# Patient Record
Sex: Female | Born: 1950 | Race: Black or African American | Hispanic: No | Smoking: Never smoker
Health system: Southern US, Community
[De-identification: ages and names within clinical notes are randomized; demographics above are authoritative.]

## PROBLEM LIST (undated history)

## (undated) ENCOUNTER — Inpatient Hospital Stay: Admission: EM | Payer: Self-pay | Source: Home / Self Care

## (undated) DIAGNOSIS — K5790 Diverticulosis of intestine, part unspecified, without perforation or abscess without bleeding: Secondary | ICD-10-CM

## (undated) DIAGNOSIS — K219 Gastro-esophageal reflux disease without esophagitis: Secondary | ICD-10-CM

## (undated) DIAGNOSIS — Z8719 Personal history of other diseases of the digestive system: Secondary | ICD-10-CM

## (undated) DIAGNOSIS — R079 Chest pain, unspecified: Secondary | ICD-10-CM

## (undated) DIAGNOSIS — M797 Fibromyalgia: Secondary | ICD-10-CM

## (undated) HISTORY — DX: Chest pain, unspecified: R07.9

## (undated) HISTORY — PX: ECTOPIC PREGNANCY SURGERY: SHX613

## (undated) HISTORY — PX: EYE SURGERY: SHX253

---

## 2007-10-29 ENCOUNTER — Ambulatory Visit: Payer: Self-pay | Admitting: Nurse Practitioner

## 2007-10-29 DIAGNOSIS — D259 Leiomyoma of uterus, unspecified: Secondary | ICD-10-CM | POA: Insufficient documentation

## 2007-10-29 DIAGNOSIS — E669 Obesity, unspecified: Secondary | ICD-10-CM | POA: Insufficient documentation

## 2007-10-29 DIAGNOSIS — J45909 Unspecified asthma, uncomplicated: Secondary | ICD-10-CM | POA: Insufficient documentation

## 2007-10-29 DIAGNOSIS — I10 Essential (primary) hypertension: Secondary | ICD-10-CM | POA: Insufficient documentation

## 2007-10-29 DIAGNOSIS — K219 Gastro-esophageal reflux disease without esophagitis: Secondary | ICD-10-CM | POA: Insufficient documentation

## 2007-11-06 ENCOUNTER — Encounter (INDEPENDENT_AMBULATORY_CARE_PROVIDER_SITE_OTHER): Payer: Self-pay | Admitting: Nurse Practitioner

## 2007-11-29 ENCOUNTER — Ambulatory Visit: Payer: Self-pay | Admitting: Nurse Practitioner

## 2007-11-29 ENCOUNTER — Encounter (INDEPENDENT_AMBULATORY_CARE_PROVIDER_SITE_OTHER): Payer: Self-pay | Admitting: Nurse Practitioner

## 2007-11-29 DIAGNOSIS — M25569 Pain in unspecified knee: Secondary | ICD-10-CM | POA: Insufficient documentation

## 2007-11-29 DIAGNOSIS — Z780 Asymptomatic menopausal state: Secondary | ICD-10-CM | POA: Insufficient documentation

## 2007-11-29 DIAGNOSIS — R3129 Other microscopic hematuria: Secondary | ICD-10-CM | POA: Insufficient documentation

## 2007-11-29 LAB — CONVERTED CEMR LAB
Albumin: 4.2 g/dL (ref 3.5–5.2)
Alkaline Phosphatase: 68 units/L (ref 39–117)
BUN: 15 mg/dL (ref 6–23)
CO2: 22 meq/L (ref 19–32)
Cholesterol: 168 mg/dL (ref 0–200)
Eosinophils Absolute: 0.1 10*3/uL (ref 0.0–0.7)
Eosinophils Relative: 2 % (ref 0–5)
GC Probe Amp, Genital: NEGATIVE
Glucose, Bld: 83 mg/dL (ref 70–99)
Glucose, Urine, Semiquant: NEGATIVE
HCT: 41.1 % (ref 36.0–46.0)
HDL: 57 mg/dL (ref >39)
Helicobacter Pylori Antibody-IgG: 0.7
Hemoglobin: 13.2 g/dL (ref 12.0–15.0)
LDL Cholesterol: 95 mg/dL (ref 0–99)
Lymphocytes Relative: 45 % (ref 12–46)
Lymphs Abs: 2.5 10*3/uL (ref 0.7–4.0)
MCV: 94.5 fL (ref 78.0–100.0)
Monocytes Relative: 7 % (ref 3–12)
Nitrite: NEGATIVE
Platelets: 258 10*3/uL (ref 150–400)
RBC: 4.35 M/uL (ref 3.87–5.11)
Sodium: 144 meq/L (ref 135–145)
Total Bilirubin: 0.5 mg/dL (ref 0.3–1.2)
Total Protein: 7.1 g/dL (ref 6.0–8.3)
Triglycerides: 79 mg/dL (ref <150)
Urobilinogen, UA: 1
VLDL: 16 mg/dL (ref 0–40)
WBC: 5.6 10*3/uL (ref 4.0–10.5)
pH: 6.5

## 2007-11-30 ENCOUNTER — Encounter (INDEPENDENT_AMBULATORY_CARE_PROVIDER_SITE_OTHER): Payer: Self-pay | Admitting: Nurse Practitioner

## 2007-12-03 ENCOUNTER — Encounter (INDEPENDENT_AMBULATORY_CARE_PROVIDER_SITE_OTHER): Payer: Self-pay | Admitting: Nurse Practitioner

## 2007-12-04 ENCOUNTER — Encounter (INDEPENDENT_AMBULATORY_CARE_PROVIDER_SITE_OTHER): Payer: Self-pay | Admitting: Nurse Practitioner

## 2007-12-12 ENCOUNTER — Ambulatory Visit (HOSPITAL_COMMUNITY): Admission: RE | Admit: 2007-12-12 | Discharge: 2007-12-12 | Payer: Self-pay | Admitting: Nurse Practitioner

## 2007-12-12 ENCOUNTER — Encounter (INDEPENDENT_AMBULATORY_CARE_PROVIDER_SITE_OTHER): Payer: Self-pay | Admitting: Nurse Practitioner

## 2008-02-29 ENCOUNTER — Ambulatory Visit: Payer: Self-pay | Admitting: Nurse Practitioner

## 2008-05-14 ENCOUNTER — Telehealth (INDEPENDENT_AMBULATORY_CARE_PROVIDER_SITE_OTHER): Payer: Self-pay | Admitting: Nurse Practitioner

## 2008-07-29 ENCOUNTER — Ambulatory Visit: Payer: Self-pay | Admitting: Nurse Practitioner

## 2008-07-29 DIAGNOSIS — R209 Unspecified disturbances of skin sensation: Secondary | ICD-10-CM | POA: Insufficient documentation

## 2008-09-04 ENCOUNTER — Encounter: Payer: Self-pay | Admitting: Physician Assistant

## 2008-09-04 ENCOUNTER — Encounter (INDEPENDENT_AMBULATORY_CARE_PROVIDER_SITE_OTHER): Payer: Self-pay | Admitting: Nurse Practitioner

## 2008-10-31 ENCOUNTER — Ambulatory Visit: Payer: Self-pay | Admitting: Internal Medicine

## 2008-10-31 DIAGNOSIS — J309 Allergic rhinitis, unspecified: Secondary | ICD-10-CM | POA: Insufficient documentation

## 2008-11-28 ENCOUNTER — Encounter (INDEPENDENT_AMBULATORY_CARE_PROVIDER_SITE_OTHER): Payer: Self-pay | Admitting: Nurse Practitioner

## 2008-11-28 ENCOUNTER — Telehealth (INDEPENDENT_AMBULATORY_CARE_PROVIDER_SITE_OTHER): Payer: Self-pay | Admitting: Nurse Practitioner

## 2008-11-28 ENCOUNTER — Ambulatory Visit: Payer: Self-pay | Admitting: Nurse Practitioner

## 2008-11-28 DIAGNOSIS — K5900 Constipation, unspecified: Secondary | ICD-10-CM | POA: Insufficient documentation

## 2008-11-28 DIAGNOSIS — R5383 Other fatigue: Secondary | ICD-10-CM | POA: Insufficient documentation

## 2008-11-28 DIAGNOSIS — R5381 Other malaise: Secondary | ICD-10-CM

## 2008-11-28 LAB — CONVERTED CEMR LAB
Bilirubin Urine: NEGATIVE
Glucose, Urine, Semiquant: NEGATIVE
Ketones, urine, test strip: NEGATIVE
Specific Gravity, Urine: 1.02
pH: 6

## 2008-12-02 ENCOUNTER — Encounter (INDEPENDENT_AMBULATORY_CARE_PROVIDER_SITE_OTHER): Payer: Self-pay | Admitting: Nurse Practitioner

## 2008-12-02 DIAGNOSIS — E559 Vitamin D deficiency, unspecified: Secondary | ICD-10-CM | POA: Insufficient documentation

## 2008-12-02 LAB — CONVERTED CEMR LAB
ALT: 11 units/L (ref 0–35)
AST: 9 units/L (ref 0–37)
Albumin: 4.1 g/dL (ref 3.5–5.2)
Alkaline Phosphatase: 59 units/L (ref 39–117)
BUN: 13 mg/dL (ref 6–23)
Basophils Absolute: 0 10*3/uL (ref 0.0–0.1)
Chlamydia, DNA Probe: NEGATIVE
GC Probe Amp, Genital: NEGATIVE
HDL: 54 mg/dL (ref >39)
LDL Cholesterol: 96 mg/dL (ref 0–99)
Lymphocytes Relative: 47 % — ABNORMAL HIGH (ref 12–46)
Lymphs Abs: 2.3 10*3/uL (ref 0.7–4.0)
Microalb, Ur: 0.98 mg/dL (ref 0.00–1.89)
Neutro Abs: 2 10*3/uL (ref 1.7–7.7)
Neutrophils Relative %: 42 % — ABNORMAL LOW (ref 43–77)
Platelets: 250 10*3/uL (ref 150–400)
Potassium: 3.9 meq/L (ref 3.5–5.3)
RDW: 13.8 % (ref 11.5–15.5)
Sodium: 144 meq/L (ref 135–145)
TSH: 1.32 microintl units/mL (ref 0.350–4.500)
Total Protein: 7 g/dL (ref 6.0–8.3)
WBC: 4.8 10*3/uL (ref 4.0–10.5)

## 2008-12-16 ENCOUNTER — Ambulatory Visit (HOSPITAL_COMMUNITY): Admission: RE | Admit: 2008-12-16 | Discharge: 2008-12-16 | Payer: Self-pay | Admitting: Internal Medicine

## 2009-03-04 ENCOUNTER — Ambulatory Visit: Payer: Self-pay | Admitting: Nurse Practitioner

## 2009-03-04 LAB — CONVERTED CEMR LAB: Vit D, 25-Hydroxy: 35 ng/mL (ref 30–89)

## 2009-03-05 ENCOUNTER — Encounter (INDEPENDENT_AMBULATORY_CARE_PROVIDER_SITE_OTHER): Payer: Self-pay | Admitting: Nurse Practitioner

## 2009-03-13 ENCOUNTER — Telehealth (INDEPENDENT_AMBULATORY_CARE_PROVIDER_SITE_OTHER): Payer: Self-pay | Admitting: Nurse Practitioner

## 2009-03-29 ENCOUNTER — Encounter (INDEPENDENT_AMBULATORY_CARE_PROVIDER_SITE_OTHER): Payer: Self-pay | Admitting: Nurse Practitioner

## 2009-03-30 ENCOUNTER — Encounter (INDEPENDENT_AMBULATORY_CARE_PROVIDER_SITE_OTHER): Payer: Self-pay | Admitting: Nurse Practitioner

## 2009-04-25 ENCOUNTER — Encounter (INDEPENDENT_AMBULATORY_CARE_PROVIDER_SITE_OTHER): Payer: Self-pay | Admitting: Nurse Practitioner

## 2009-05-06 ENCOUNTER — Telehealth (INDEPENDENT_AMBULATORY_CARE_PROVIDER_SITE_OTHER): Payer: Self-pay | Admitting: Nurse Practitioner

## 2009-11-16 ENCOUNTER — Telehealth (INDEPENDENT_AMBULATORY_CARE_PROVIDER_SITE_OTHER): Payer: Self-pay | Admitting: Nurse Practitioner

## 2009-11-25 ENCOUNTER — Ambulatory Visit: Payer: Self-pay | Admitting: Nurse Practitioner

## 2009-11-26 ENCOUNTER — Encounter (INDEPENDENT_AMBULATORY_CARE_PROVIDER_SITE_OTHER): Payer: Self-pay | Admitting: Nurse Practitioner

## 2009-11-26 DIAGNOSIS — R809 Proteinuria, unspecified: Secondary | ICD-10-CM | POA: Insufficient documentation

## 2009-11-26 LAB — CONVERTED CEMR LAB
ALT: 9 units/L (ref 0–35)
Alkaline Phosphatase: 61 units/L (ref 39–117)
Basophils Absolute: 0 10*3/uL (ref 0.0–0.1)
Eosinophils Absolute: 0.2 10*3/uL (ref 0.0–0.7)
Eosinophils Relative: 3 % (ref 0–5)
HCT: 40.4 % (ref 36.0–46.0)
Lymphocytes Relative: 42 % (ref 12–46)
MCV: 92 fL (ref 78.0–100.0)
Platelets: 259 10*3/uL (ref 150–400)
RDW: 13.7 % (ref 11.5–15.5)
Sodium: 142 meq/L (ref 135–145)
Total Bilirubin: 0.3 mg/dL (ref 0.3–1.2)
Total Protein: 6.7 g/dL (ref 6.0–8.3)

## 2009-11-27 ENCOUNTER — Telehealth (INDEPENDENT_AMBULATORY_CARE_PROVIDER_SITE_OTHER): Payer: Self-pay | Admitting: Nurse Practitioner

## 2009-11-27 ENCOUNTER — Ambulatory Visit: Payer: Self-pay | Admitting: Nurse Practitioner

## 2009-11-27 LAB — CONVERTED CEMR LAB
HDL: 58 mg/dL (ref >39)
LDL Cholesterol: 109 mg/dL — ABNORMAL HIGH (ref 0–99)
Triglycerides: 83 mg/dL (ref <150)
VLDL: 17 mg/dL (ref 0–40)

## 2009-12-01 ENCOUNTER — Encounter (INDEPENDENT_AMBULATORY_CARE_PROVIDER_SITE_OTHER): Payer: Self-pay | Admitting: Nurse Practitioner

## 2009-12-17 ENCOUNTER — Ambulatory Visit (HOSPITAL_COMMUNITY)
Admission: RE | Admit: 2009-12-17 | Discharge: 2009-12-17 | Payer: Self-pay | Source: Home / Self Care | Admitting: Internal Medicine

## 2010-03-07 ENCOUNTER — Encounter: Payer: Self-pay | Admitting: Internal Medicine

## 2010-03-16 NOTE — Letter (Signed)
Summary: TRIAGE CALL  TRIAGE CALL   Imported By: Arta Bruce 07/16/2009 14:22:16  _____________________________________________________________________  External Attachment:    Type:   Image     Comment:   External Document

## 2010-03-16 NOTE — Letter (Signed)
Summary: Lipid Letter  [facility]  [address] [place], [place] [address]   Phone: [phone]  Fax: [phone]    12/01/2009  Jyoti Harju [address] [address] [place], [place]  [address]  Dear Ms. Doepke:  We have carefully reviewed your last lipid profile from 11/27/2009 and the results are noted below with a summary of recommendations for lipid management.    Cholesterol:       184     Goal: less than 200   HDL "good" Cholesterol:   58     Goal: greater than 40   LDL "bad" Cholesterol:   109     Goal: less than 130   Triglycerides:       83     Goal: less than 150    Cholesterol labs are normal.     Current Medications: 1)    Hydrochlorothiazide 12.5 Mg Tabs (Hydrochlorothiazide) .Marland Kitchen.. 1 tablet by mouth daily for blood pressure 2)    Nexium 40 Mg Cpdr (Esomeprazole magnesium) .... One tablet by mouth 30 minutes before breakfast daily 3)    Fluticasone Propionate 50 Mcg/act Susp (Fluticasone propionate) .... 2 sprays each nostril daily 4)    Cetirizine Hcl 10 Mg Tabs (Cetirizine hcl) .... One tablet by mouth daily for allergies 5)    Patanol 0.1 % Soln (Olopatadine hcl) .Marland Kitchen.. 1-2 drops both eyes two times a day as needed allergies 6)    Miralax  Powd (Polyethylene glycol 3350) .Marland Kitchen.. 1 capful of powder in 8oz of water or juice daily.  If you have any questions, please call. We appreciate being able to work with you.   Sincerely,    [facility] Lehman Prom FNP

## 2010-03-16 NOTE — Medication Information (Signed)
Summary: RX Folder//MAP   RX Folder//MAP   Imported By: Arta Bruce 03/30/2009 10:52:05  _____________________________________________________________________  External Attachment:    Type:   Image     Comment:   External Document

## 2010-03-16 NOTE — Progress Notes (Signed)
Summary: HCTZ refill  Phone Note Outgoing Call   Summary of Call: I see pt called - CALL A NURSE for HCTZ refill. she was instructed to call this office the following monday for refills and i don't see that she did call pt - let her know Rx is ready -  fax to [facility] (in basket) Initial call taken by: Lehman Prom FNP,  May 06, 2009 8:31 AM  Follow-up for Phone Call        Brooke Marshall  May 06, 2009 8:57 AM Left message on machine for pt to return call to the office.  pt informed. Follow-up by: Brooke Marshall,  May 06, 2009 3:08 PM    Prescriptions: HYDROCHLOROTHIAZIDE 12.5 MG TABS (HYDROCHLOROTHIAZIDE) 1 tablet by mouth daily for blood pressure  #30 x 6   Entered and Authorized by:   Lehman Prom FNP   Signed by:   Lehman Prom FNP on 05/06/2009   Method used:   Print then Give to Patient   RxID:   [id number]   Appended Document: HCTZ refill Rx faxed to [facility] pharmacy

## 2010-03-16 NOTE — Assessment & Plan Note (Signed)
Summary: HTN   Vital Signs:  Patient profile:   60 year old female Menstrual status:  postmenopausal Height:      66 inches Weight:      274.4 pounds BMI:     44.45 Temp:     97.6 degrees F oral Pulse rate:   64 / minute Pulse rhythm:   regular Resp:     16 per minute BP sitting:   122 / 80  (left arm) Cuff size:   large  Vitals Entered By: Levon Hedger (November 25, 2009 3:15 PM)  Nutrition Counseling: Patient's BMI is greater than 25 and therefore counseled on weight management options. CC: needs check up has not been seen in office in 1 year, Hypertension Management, Abdominal Pain Is Patient Diabetic? No Pain Assessment Patient in pain? no       Does patient need assistance? Functional Status Self care Ambulation Normal   CC:  needs check up has not been seen in office in 1 year, Hypertension Management, and Abdominal Pain.  History of Present Illness:  P tinto the office for f/u - HTN last visit 1 year ago  Dyspepsia History:      She has no alarm features of dyspepsia including no history of melena, hematochezia, dysphagia, persistent vomiting, or involuntary weight loss > 5%.  There is a prior history of GERD.  The patient does not have a prior history of documented ulcer disease.  The dominant symptom is heartburn or acid reflux.  An H-2 blocker medication is not currently being taken.  A prior EGD has been done which showed no evidence for moderate or severe esophagitis or Barrett's esophagus.    Hypertension History:      She denies headache, chest pain, and palpitations.  She notes no problems with any antihypertensive medication side effects.        Positive major cardiovascular risk factors include female age 40 years old or older and hypertension.  Negative major cardiovascular risk factors include negative family history for ischemic heart disease and non-tobacco-user status.        Further assessment for target organ damage reveals no history of ASHD,  cardiac end-organ damage (CHF/LVH), stroke/TIA, peripheral vascular disease, renal insufficiency, or hypertensive retinopathy.      Medications Prior to Update: 1)  Hydrochlorothiazide 12.5 Mg Tabs (Hydrochlorothiazide) .Marland Kitchen.. 1 Tablet By Mouth Daily For Blood Pressure 2)  Nexium 40 Mg Cpdr (Esomeprazole Magnesium) .... One Tablet By Mouth 30 Minutes Before Breakfast Daily 3)  Fluticasone Propionate 50 Mcg/act Susp (Fluticasone Propionate) .... 2 Sprays Each Nostril Daily 4)  Fexofenadine Hcl 180 Mg Tabs (Fexofenadine Hcl) .Marland Kitchen.. 1 Tab By Mouth Daily As Needed For Allergies 5)  Patanol 0.1 % Soln (Olopatadine Hcl) .Marland Kitchen.. 1-2 Drops Both Eyes Two Times A Day As Needed Allergies 6)  Tramadol Hcl 50 Mg Tabs (Tramadol Hcl) .... Take One Tablet By Mouth 4 Times Daily As Needed *stephen Miller,md 7)  Vitamin D (Ergocalciferol) 50000 Unit Caps (Ergocalciferol) .... One Tablet By Mouth Weekly 8)  Miralax  Powd (Polyethylene Glycol 3350) .Marland Kitchen.. 1 Capful of Powder in 8oz of Water or Juice Daily.  Allergies (verified): No Known Drug Allergies  Review of Systems General:  Denies fatigue. CV:  Denies chest pain or discomfort. Resp:  Denies cough. GI:  Denies abdominal pain, nausea, and vomiting. MS:  Complains of joint pain and stiffness; right knee stiffness at times; mostly when she goes from sitting to standing position. Allergy:  Denies itching eyes and sneezing;  symptoms are controlled now with patanol and cetirizine.  Physical Exam  General:  alert.   Head:  normocephalic.   Eyes:  pupils reactive to light.   Ears:  ear piercing(s) noted.   Lungs:  normal breath sounds.   Heart:  normal rate and regular rhythm.   Abdomen:  normal bowel sounds.   Msk:  normal ROM.   Neurologic:  alert & oriented X3.   Skin:  color normal.   Psych:  Oriented X3.     Impression & Recommendations:  Problem # 1:  HYPERTENSION, BENIGN ESSENTIAL (ICD-401.1) BP is stable DASH diet Her updated medication list for  this problem includes:    Hydrochlorothiazide 12.5 Mg Tabs (Hydrochlorothiazide) .Marland Kitchen... 1 tablet by mouth daily for blood pressure  Orders: T-Comprehensive Metabolic Panel (45409-81191) T-CBC w/Diff (47829-56213) T-Syphilis Test (RPR) (08657-84696) T-TSH (29528-41324) Rapid HIV  (40102) T-Urine Microalbumin w/creat. ratio [id number]) UA Dipstick w/o Micro (manual) (59563)  Problem # 2:  OBESITY (ICD-278.00) weight gain since last visit  advised pt to increase her exercise  Problem # 3:  VITAMIN D DEFICIENCY (ICD-268.9) pt is taking vitamin supplement daily  Problem # 4:  REACTIVE AIRWAY DISEASE (ICD-493.90) stable  Complete Medication List: 1)  Hydrochlorothiazide 12.5 Mg Tabs (Hydrochlorothiazide) .Marland Kitchen.. 1 tablet by mouth daily for blood pressure 2)  Nexium 40 Mg Cpdr (Esomeprazole magnesium) .... One tablet by mouth 30 minutes before breakfast daily 3)  Fluticasone Propionate 50 Mcg/act Susp (Fluticasone propionate) .... 2 sprays each nostril daily 4)  Cetirizine Hcl 10 Mg Tabs (Cetirizine hcl) .... One tablet by mouth daily for allergies 5)  Patanol 0.1 % Soln (Olopatadine hcl) .Marland Kitchen.. 1-2 drops both eyes two times a day as needed allergies 6)  Miralax Powd (Polyethylene glycol 3350) .Marland Kitchen.. 1 capful of powder in 8oz of water or juice daily.  Other Orders: Mammogram (Screening) (Mammo)  Hypertension Assessment/Plan:      The patient's hypertensive risk group is category B: At least one risk factor (excluding diabetes) with no target organ damage.  Her calculated 10 year risk of coronary heart disease is 7 %.  Today's blood pressure is 122/80.  Her blood pressure goal is < 140/90.  Patient Instructions: 1)  You have declined the flu vaccine today.  If you reconsider then inform this office and you can get a nurse visit. Remember the flu vaccine is not a live virus so it will not make you sick. 2)  Keep your appointment for mammogram 3)  Schedule an appointment for a lab visit -  lipids 4)  no food after midnight before this visit. 5)  Continue current medications.   6)  Follow up at least every 6 months for high blood pressure. Prescriptions: HYDROCHLOROTHIAZIDE 12.5 MG TABS (HYDROCHLOROTHIAZIDE) 1 tablet by mouth daily for blood pressure  #30 x 6   Entered and Authorized by:   Lehman Prom FNP   Signed by:   Lehman Prom FNP on 11/25/2009   Method used:   Print then Give to Patient   RxID:   [id number] NEXIUM 40 MG CPDR (ESOMEPRAZOLE MAGNESIUM) One tablet by mouth 30 minutes before breakfast daily  #30 x 5   Entered and Authorized by:   Lehman Prom FNP   Signed by:   Lehman Prom FNP on 11/25/2009   Method used:   Print then Give to Patient   RxID:   [id number]   Prevention & Chronic Care Immunizations   Influenza vaccine: refused  (11/25/2009)   Influenza vaccine deferral:  Refused  (11/28/2008)    Tetanus booster: 11/29/2007: Tdap    Pneumococcal vaccine: Not documented    H. zoster vaccine: Not documented  Colorectal Screening   Hemoccult: Not documented   Hemoccult action/deferral: Given x 3  (11/29/2007)    Colonoscopy: Results: Diverticulosis.         (02/14/2002)   Colonoscopy action/deferral: Repeat colonoscopy in 5 years.   (02/14/2002)  Other Screening   Pap smear:  Specimen Adequacy: Satisfactory for evaluation.   Interpretation/Result:Negative for intraepithelial Lesion or Malignancy.     (11/28/2008)   Pap smear action/deferral: PAP smear done  (11/29/2007)    Mammogram: ASSESSMENT: Negative - BI-RADS 1^MM DIGITAL SCREENING  (12/16/2008)   Mammogram action/deferral: mammogram ordered  (11/29/2007)    DXA bone density scan: normal  (12/12/2007)   Smoking status: never  (11/28/2008)  Lipids   Total Cholesterol: 168  (11/28/2008)   LDL: 96  (11/28/2008)   LDL Direct: Not documented   HDL: 54  (11/28/2008)   Triglycerides: 92  (11/28/2008)  Hypertension   Last Blood Pressure: 122 / 80   (11/25/2009)   Serum creatinine: 0.89  (11/28/2008)   Serum potassium 3.9  (11/28/2008) CMP ordered   Self-Management Support :    Hypertension self-management support: Not documented  Appended Document: HTN    Clinical Lists Changes  Observations: Added new observation of SPEC GR URIN: >=1.030 (11/25/2009 17:10) Added new observation of PH URINE: 5.5  (11/25/2009 17:10) Added new observation of UA COLOR: orange  (11/25/2009 17:10) Added new observation of WBC DIPSTK U: negative  (11/25/2009 17:10) Added new observation of NITRITE URN: negative  (11/25/2009 17:10) Added new observation of UROBILINOGEN: 0.2  (11/25/2009 17:10) Added new observation of PROTEIN, URN: negative  (11/25/2009 17:10) Added new observation of BLOOD UR DIP: small  (11/25/2009 17:10) Added new observation of KETONES URN: small (15)  (11/25/2009 17:10) Added new observation of BILIRUBIN UR: small  (11/25/2009 17:10) Added new observation of GLUCOSE, URN: negative  (11/25/2009 17:10)      Laboratory Results   Urine Tests  Date/Time Received: November 25, 2009 5:10 PM   Routine Urinalysis   Color: orange Glucose: negative   (Normal Range: Negative) Bilirubin: small   (Normal Range: Negative) Ketone: small (15)   (Normal Range: Negative) Spec. Gravity: >=1.030   (Normal Range: 1.003-1.035) Blood: small   (Normal Range: Negative) pH: 5.5   (Normal Range: 5.0-8.0) Protein: negative   (Normal Range: Negative) Urobilinogen: 0.2   (Normal Range: 0-1) Nitrite: negative   (Normal Range: Negative) Leukocyte Esterace: negative   (Normal Range: Negative)

## 2010-03-16 NOTE — Miscellaneous (Signed)
Summary: Med change - Nexium  Clinical Lists Changes  Medications: Changed medication from OMEPRAZOLE 20 MG TBEC (OMEPRAZOLE) 1 tablet by mouth two times a day for stomach to NEXIUM 40 MG CPDR (ESOMEPRAZOLE MAGNESIUM) One tablet by mouth 30 minutes before breakfast daily - Signed Rx of NEXIUM 40 MG CPDR (ESOMEPRAZOLE MAGNESIUM) One tablet by mouth 30 minutes before breakfast daily;  #30 x 5;  Signed;  Entered by: Lehman Prom FNP;  Authorized by: Lehman Prom FNP;  Method used: Printed then faxed to [facility], [address] [place], [place], [place]  [address], Ph: [phone], Fax: [phone]    Prescriptions: NEXIUM 40 MG CPDR (ESOMEPRAZOLE MAGNESIUM) One tablet by mouth 30 minutes before breakfast daily  #30 x 5   Entered and Authorized by:   Lehman Prom FNP   Signed by:   Lehman Prom FNP on 03/29/2009   Method used:   Printed then faxed to ...       [facility] Department (retail)       [address] [place], [place]  [address]       Ph: [phone]       Fax: [phone]   RxID:   [id number]

## 2010-03-16 NOTE — Letter (Signed)
Summary: *HSN Results Follow up  [facility]  [address] [place], [place] [address]   Phone: [phone]  Fax: [phone]      11/26/2009   [facility] [place] [address] [place], [place]  [address]   Dear  Ms. Jailyne Kelty,                            ____S.Drinkard,FNP   ____D. Gore,FNP       ____B. McPherson,MD   ____V. Rankins,MD    ____E. Mulberry,MD    __X__N. Daphine Deutscher, FNP  ____D. Reche Dixon, MD    ____K. Philipp Deputy, MD    ____Other     This letter is to inform you that your recent test(s):  _______Pap Smear    ___X____Lab Test     _______X-ray    ___X____ is within acceptable limits  _______ requires a medication change  _______ requires a follow-up lab visit  _______ requires a follow-up visit with your provider   Comments: Labs done during recent office visit are normal.       _________________________________________________________ If you have any questions, please contact our office [phone].                    Sincerely,    Lehman Prom FNP [facility]

## 2010-03-16 NOTE — Progress Notes (Signed)
Summary: Stomach problems  Phone Note Call from Patient Call back at [facility] Phone [phone] Call back at [phone]   Summary of Call: The pt is aware that her lab results was mailed, but she still has some concern in reference to her stomach perhaps  because she was taking fish vitamin but it seems that she has a lot of flatulence and  her stomach has a strange sound and also pain.  The pt had been taking yogurt, grain cereal but seem not helping her.  Please call her back as soon possible. [facility] FNP Initial call taken by: Manon Hilding,  March 13, 2009 10:29 AM  Follow-up for Phone Call        Levon Hedger  March 16, 2009 9:31 AM pt states she was having cutting gas pains and wasnt feeling good,  not going to the bathroom for a bowel movement ,alot of  gas x 2 weeks and finallly had a bowel movement on Saturday but was not that large she has no appetite full and bloated feeling nervous and gittery.  Pt states her mother had bowel problem and this has got her very nervous  Levon Hedger  March 16, 2009 9:39 AM   Additional Follow-up for Phone Call Additional follow up Details #1::        It appears that pt does have a history of constipation for which she should be eating lots of raisins, apples with peeling, beans, grean veges, oatmeal to keep stools soft.  She should also drink plenty of water and walking helps.  Eating lots of cheese constipations pt so she should avoid. I would advise she also start a fiber supplement - miralax. Mix 1 capful with 8oz of water or juice daily.  This is available over the counter but would be less expensive if she purchased from [facility]. Rx faxed there.  Additional Follow-up by: Lehman Prom FNP,  March 16, 2009 10:03 AM    Additional Follow-up for Phone Call Additional follow up Details #2::    spoke with pt she is aware of above information. Follow-up by: Levon Hedger,  March 16, 2009 10:17 AM  New/Updated  Medications: MIRALAX  POWD (POLYETHYLENE GLYCOL 3350) 1 capful of powder in 8oz of water or juice daily. Prescriptions: MIRALAX  POWD (POLYETHYLENE GLYCOL 3350) 1 capful of powder in 8oz of water or juice daily.  #1 month qs x 5   Entered and Authorized by:   Lehman Prom FNP   Signed by:   Lehman Prom FNP on 03/16/2009   Method used:   Faxed to ...       [facility] (retail)       [address] [place], [place]  [address]       Ph: [phone]       Fax: [phone]   RxID:   [id number]

## 2010-03-16 NOTE — Letter (Signed)
Summary: *HSN Results Follow up  [facility]  [address] [place], [place] [address]   Phone: [phone]  Fax: [phone]      03/05/2009   [facility] [place] [address] [place], [place]  [address]   Dear  Ms. Brooke Marshall,                            ____S.Drinkard,FNP   ____D. Gore,FNP       ____B. McPherson,MD   ____V. Rankins,MD    ____E. Mulberry,MD    __X__N. Daphine Deutscher, FNP  ____D. Reche Dixon, MD    ____K. Philipp Deputy, MD    ____Other     This letter is to inform you that your recent test(s):  _______Pap Smear    ___X____Lab Test     _______X-ray    ___X____ is within acceptable limits  _______ requires a medication change  _______ requires a follow-up lab visit  _______ requires a follow-up visit with your provider   Comments:  Your Vitamin D level is now within normal limits.  I would suggest you take a multivitamin daily (this should contain a daily amount of vitamin D) to be sure your level stays up.  _________________________________________________________ If you have any questions, please contact our office [phone].                    Sincerely,    Lehman Prom FNP [facility]

## 2010-03-16 NOTE — Progress Notes (Signed)
Summary: requesting addidtional test  Phone Note Call from Patient Call back at Home Phone [phone]   Action Taken: Phone Call Completed Summary of Call: pt states she wanted to see how long before she is due for another colonscopy and endoscopy. last was in 2004. she thought we recieved results from records from [place]. she was diagnosised with diverticulitis and hiatial hernia. Initial call taken by: Hassell Halim CMA,  November 27, 2009 8:51 AM  Follow-up for Phone Call        looked back - yes, pt had test in 2004 and was dx with diverticulosis. next colonscopy recommended in 2014. If pt is continues to have a problem she may consider having a barium enema which we can schedule at [facility] but she will need to do the exercises to strengthen up her spincter tone as discussed during recent visit. Follow-up by: Lehman Prom FNP,  November 27, 2009 9:14 AM  Additional Follow-up for Phone Call Additional follow up Details #1::        Pt. notified of above and encouraged to do exercises as instructed. Additional Follow-up by: Gaylyn Cheers RN,  December 02, 2009 11:39 AM

## 2010-03-16 NOTE — Progress Notes (Signed)
Summary: Query:  Refill Flonase?  Phone Note Outgoing Call   Summary of Call: Do you want to refill her flonase?  Last seen 11/14/08. Initial call taken by: Dutch Quint RN,  November 16, 2009 12:30 PM  Follow-up for Phone Call        ? if she is still a pt here - thought she transfered to another office contact pt and ask her. if she is still a HS pt advised that she needs an appt. if her orange card is not current then get her an eligibilty appt.   May refill x 30 days if she is still a pt Follow-up by: Lehman Prom FNP,  November 17, 2009 8:42 AM  Additional Follow-up for Phone Call Additional follow up Details #1::        Left message on voicemail for pt. to return call.  Dutch Quint RN  November 17, 2009 2:30 PM     Additional Follow-up for Phone Call Additional follow up Details #2::    PATIENT CALLED TO SEE ABOUT NEXIUM, SHE SAYS SHE DIDN'T ASK ABOUT ABOUT ANY NASAL SPRAY, and she is still a patient here.Cala Bradford Tinnin  November 18, 2009 10:22 AM  States she only needs Nexium refilled -- not the Flonase.  Appt. made for 11/25/09.  Refill Nexium for 30 days?  Dutch Quint RN  November 18, 2009 11:31 AM  yes, ok to  refill her nexium now for 30 days. n.martin,fnp November 18, 2009  12:58 PM  Refills completed.  Dutch Quint RN  November 19, 2009 5:01 PM   Prescriptions: FLUTICASONE PROPIONATE 50 MCG/ACT SUSP (FLUTICASONE PROPIONATE) 2 sprays each nostril daily  #1 x 0   Entered by:   Dutch Quint RN   Authorized by:   Lehman Prom FNP   Signed by:   Dutch Quint RN on 11/19/2009   Method used:   Printed then faxed to ...       [facility] Department (retail)       [address] [place], [place]  [address]       Ph: [phone]       Fax: [phone]   RxID:   [id number] NEXIUM 40 MG CPDR (ESOMEPRAZOLE MAGNESIUM) One tablet by mouth 30 minutes before breakfast daily  #30 x 0   Entered by:   Dutch Quint RN   Authorized by:   Lehman Prom  FNP   Signed by:   Dutch Quint RN on 11/19/2009   Method used:   Printed then faxed to ...       [facility] Department (retail)       [address] [place], [place]  [address]       Ph: [phone]       Fax: [phone]   RxID:   [id number]

## 2010-04-09 ENCOUNTER — Telehealth (INDEPENDENT_AMBULATORY_CARE_PROVIDER_SITE_OTHER): Payer: Self-pay | Admitting: Nurse Practitioner

## 2010-04-22 NOTE — Progress Notes (Signed)
Summary: Query:  Refill flonase?  Phone Note Outgoing Call   Summary of Call: Last seen early October 2011.  Refill flonase and HCTZ per protocol? Initial call taken by: Dutch Quint RN,  April 09, 2010 6:30 PM  Follow-up for Phone Call        yes, ok to refill Follow-up by: Lehman Prom FNP,  April 13, 2010 1:29 PM  Additional Follow-up for Phone Call Additional follow up Details #1::        Noted.  Refill completed.  Dutch Quint RN  April 13, 2010 4:20 PM     Prescriptions: FLUTICASONE PROPIONATE 50 MCG/ACT SUSP (FLUTICASONE PROPIONATE) 2 sprays each nostril daily  #1 x 0   Entered by:   Dutch Quint RN   Authorized by:   Lehman Prom FNP   Signed by:   Dutch Quint RN on 04/13/2010   Method used:   Historical   RxID:   [id number]

## 2010-12-29 ENCOUNTER — Other Ambulatory Visit: Payer: Self-pay | Admitting: Internal Medicine

## 2010-12-29 DIAGNOSIS — Z1231 Encounter for screening mammogram for malignant neoplasm of breast: Secondary | ICD-10-CM

## 2011-01-27 ENCOUNTER — Ambulatory Visit (HOSPITAL_COMMUNITY)
Admission: RE | Admit: 2011-01-27 | Discharge: 2011-01-27 | Disposition: A | Discharge status: Home / Self Care | Payer: Self-pay | Source: Ambulatory Visit | Attending: Internal Medicine | Admitting: Internal Medicine

## 2011-01-27 DIAGNOSIS — Z1231 Encounter for screening mammogram for malignant neoplasm of breast: Secondary | ICD-10-CM | POA: Insufficient documentation

## 2011-03-08 ENCOUNTER — Other Ambulatory Visit: Payer: Self-pay | Admitting: Internal Medicine

## 2014-01-23 ENCOUNTER — Other Ambulatory Visit (HOSPITAL_COMMUNITY): Payer: Self-pay | Admitting: Nurse Practitioner

## 2014-01-23 DIAGNOSIS — Z1231 Encounter for screening mammogram for malignant neoplasm of breast: Secondary | ICD-10-CM

## 2014-01-31 ENCOUNTER — Ambulatory Visit (HOSPITAL_COMMUNITY)
Admission: RE | Admit: 2014-01-31 | Discharge: 2014-01-31 | Disposition: A | Discharge status: Home / Self Care | Payer: 59 | Source: Ambulatory Visit | Attending: Nurse Practitioner | Admitting: Nurse Practitioner

## 2014-01-31 DIAGNOSIS — Z1231 Encounter for screening mammogram for malignant neoplasm of breast: Secondary | ICD-10-CM | POA: Insufficient documentation

## 2014-07-30 ENCOUNTER — Other Ambulatory Visit: Payer: Self-pay | Admitting: Gastroenterology

## 2014-10-28 ENCOUNTER — Encounter (HOSPITAL_COMMUNITY): Payer: Self-pay | Admitting: *Deleted

## 2014-11-04 ENCOUNTER — Encounter (HOSPITAL_COMMUNITY): Payer: Self-pay | Admitting: *Deleted

## 2014-11-04 ENCOUNTER — Encounter (HOSPITAL_COMMUNITY)
Admission: RE | Disposition: A | Discharge status: Home / Self Care | Payer: Self-pay | Source: Ambulatory Visit | Attending: Gastroenterology

## 2014-11-04 ENCOUNTER — Ambulatory Visit (HOSPITAL_COMMUNITY): Payer: PPO | Admitting: Anesthesiology

## 2014-11-04 ENCOUNTER — Ambulatory Visit (HOSPITAL_COMMUNITY)
Admission: RE | Admit: 2014-11-04 | Discharge: 2014-11-04 | Disposition: A | Discharge status: Home / Self Care | Payer: PPO | Source: Ambulatory Visit | Attending: Gastroenterology | Admitting: Gastroenterology

## 2014-11-04 DIAGNOSIS — K219 Gastro-esophageal reflux disease without esophagitis: Secondary | ICD-10-CM | POA: Diagnosis not present

## 2014-11-04 DIAGNOSIS — I10 Essential (primary) hypertension: Secondary | ICD-10-CM | POA: Diagnosis not present

## 2014-11-04 DIAGNOSIS — K449 Diaphragmatic hernia without obstruction or gangrene: Secondary | ICD-10-CM | POA: Insufficient documentation

## 2014-11-04 DIAGNOSIS — Z800 Family history of malignant neoplasm of digestive organs: Secondary | ICD-10-CM | POA: Diagnosis not present

## 2014-11-04 DIAGNOSIS — Z6841 Body Mass Index (BMI) 40.0 and over, adult: Secondary | ICD-10-CM | POA: Insufficient documentation

## 2014-11-04 DIAGNOSIS — Z1211 Encounter for screening for malignant neoplasm of colon: Secondary | ICD-10-CM | POA: Insufficient documentation

## 2014-11-04 HISTORY — DX: Personal history of other diseases of the digestive system: Z87.19

## 2014-11-04 HISTORY — DX: Diverticulosis of intestine, part unspecified, without perforation or abscess without bleeding: K57.90

## 2014-11-04 HISTORY — PX: COLONOSCOPY WITH PROPOFOL: SHX5780

## 2014-11-04 HISTORY — DX: Fibromyalgia: M79.7

## 2014-11-04 HISTORY — DX: Gastro-esophageal reflux disease without esophagitis: K21.9

## 2014-11-04 SURGERY — COLONOSCOPY WITH PROPOFOL
Anesthesia: Monitor Anesthesia Care

## 2014-11-04 MED ORDER — FENTANYL CITRATE (PF) 100 MCG/2ML IJ SOLN: 25.0000 ug | INTRAMUSCULAR | Status: DC | PRN

## 2014-11-04 MED ORDER — SODIUM CHLORIDE 0.9 % IV SOLN: INTRAVENOUS | Status: DC

## 2014-11-04 MED ORDER — PROPOFOL 10 MG/ML IV BOLUS
INTRAVENOUS | Status: AC
  Filled 2014-11-04: qty 20

## 2014-11-04 MED ORDER — PROPOFOL 10 MG/ML IV BOLUS
INTRAVENOUS | Status: DC | PRN
  Administered 2014-11-04 (×6): 40 mg via INTRAVENOUS

## 2014-11-04 MED ORDER — LACTATED RINGERS IV SOLN
INTRAVENOUS | Status: DC
  Administered 2014-11-04: 09:00:00 via INTRAVENOUS

## 2014-11-04 SURGICAL SUPPLY — 21 items

## 2014-11-04 NOTE — Anesthesia Postprocedure Evaluation (Signed)
  Anesthesia Post-op Note  Patient: Brooke Marshall  Procedure(s) Performed: Procedure(s) (LRB): COLONOSCOPY WITH PROPOFOL (N/A)  Patient Location: PACU  Anesthesia Type: MAC  Level of Consciousness: awake and alert   Airway and Oxygen Therapy: Patient Spontanous Breathing  Post-op Pain: mild  Post-op Assessment: Post-op Vital signs reviewed, Patient's Cardiovascular Status Stable, Respiratory Function Stable, Patent Airway and No signs of Nausea or vomiting  Last Vitals:  Filed Vitals:   11/04/14 1110  BP: 164/71  Pulse: 49  Temp:   Resp: 19    Post-op Vital Signs: stable   Complications: No apparent anesthesia complications

## 2014-11-04 NOTE — Discharge Instructions (Signed)

## 2014-11-04 NOTE — H&P (Signed)
  Procedure: Screening colonoscopy. Mother died of colon cancer at age 65.  History: The patient is a 64 year old female born [birth year]. She is scheduled to undergo a screening colonoscopy today.  Medication allergies: None  Past medical history: Hypertension. Gastroesophageal reflux with hiatal hernia. Colonic diverticulosis. Uterine fibroid. Left eye surgery. Ectopic pregnancy diagnosed in 1985. Cesarean section.  Family history: Mother died of colon cancer at age 86.  Exam: The patient is alert and lying comfortably on the endoscopy stretcher. Abdomen is soft and nontender to palpation. Cardiac exam reveals a regular rhythm. Lungs are clear to auscultation.  Plan: Proceed with screening colonoscopy

## 2014-11-04 NOTE — Anesthesia Preprocedure Evaluation (Addendum)
Anesthesia Evaluation  Patient identified by MRN, date of birth, ID band Patient awake    Reviewed: Allergy & Precautions, H&P , NPO status , Patient's Chart, lab work & pertinent test results  Airway Mallampati: II  TM Distance: >3 FB Neck ROM: full    Dental no notable dental hx. (+) Dental Advisory Given, Teeth Intact   Pulmonary neg pulmonary ROS,    Pulmonary exam normal breath sounds clear to auscultation       Cardiovascular Exercise Tolerance: Good hypertension, Pt. on medications Normal cardiovascular exam Rhythm:regular Rate:Normal     Neuro/Psych negative neurological ROS  negative psych ROS   GI/Hepatic negative GI ROS, Neg liver ROS, GERD  Medicated and Controlled,  Endo/Other  negative endocrine ROSMorbid obesity  Renal/GU negative Renal ROS  negative genitourinary   Musculoskeletal   Abdominal (+) + obese,   Peds  Hematology negative hematology ROS (+)   Anesthesia Other Findings   Reproductive/Obstetrics negative OB ROS                           Anesthesia Physical Anesthesia Plan  ASA: III  Anesthesia Plan: MAC   Post-op Pain Management:    Induction:   Airway Management Planned:   Additional Equipment:   Intra-op Plan:   Post-operative Plan:   Informed Consent: I have reviewed the patients History and Physical, chart, labs and discussed the procedure including the risks, benefits and alternatives for the proposed anesthesia with the patient or authorized representative who has indicated his/her understanding and acceptance.   Dental Advisory Given  Plan Discussed with: CRNA and Surgeon  Anesthesia Plan Comments:        Anesthesia Quick Evaluation

## 2014-11-04 NOTE — Transfer of Care (Signed)
Immediate Anesthesia Transfer of Care Note  Patient: Brooke Marshall  Procedure(s) Performed: Procedure(s): COLONOSCOPY WITH PROPOFOL (N/A)  Patient Location: PACU and Endoscopy Unit  Anesthesia Type:MAC  Level of Consciousness: awake, oriented, patient cooperative, lethargic and responds to stimulation  Airway & Oxygen Therapy: Patient Spontanous Breathing and Patient connected to face mask oxygen  Post-op Assessment: Report given to RN, Post -op Vital signs reviewed and stable and Patient moving all extremities  Post vital signs: Reviewed and stable  Last Vitals:  Filed Vitals:   11/04/14 0916  BP: 159/97  Temp: 36.6 C  Resp: 17    Complications: No apparent anesthesia complications

## 2014-11-04 NOTE — Op Note (Signed)
Procedure: Screening colonoscopy. Mother diagnosed with colon cancer  Endoscopist: Earle Gell  Premedication: Propofol administered by anesthesia  Procedure: The patient was placed in the left lateral decubitus position. Anal inspection and digital rectal exam were normal. The Pentax pediatric colonoscope was introduced into the rectum and advanced to the cecum. A normal-appearing ileocecal valve and appendiceal orifice were identified. Colonic preparation for the exam today was good. Withdrawal time was 8 minutes  Rectum. Normal. Retroflex view of the distal rectum was  Sigmoid colon and descending colon. Left colonic diverticulosis  Splenic flexure. Normal  Transverse colon. Normal  Hepatic flexure. Normal  Ascending colon. Normal.  Cecum and ileocecal valve. Normal  Assessment: Normal screening colonoscopy  Recommendation: Schedule repeat screening colonoscopy in 5 years

## 2015-01-12 ENCOUNTER — Other Ambulatory Visit: Payer: Self-pay

## 2015-01-12 DIAGNOSIS — Z1231 Encounter for screening mammogram for malignant neoplasm of breast: Secondary | ICD-10-CM

## 2015-02-12 ENCOUNTER — Ambulatory Visit
Admission: RE | Admit: 2015-02-12 | Discharge: 2015-02-12 | Disposition: A | Discharge status: Home / Self Care | Payer: PPO | Source: Ambulatory Visit

## 2015-02-12 ENCOUNTER — Other Ambulatory Visit (HOSPITAL_COMMUNITY)
Admission: RE | Admit: 2015-02-12 | Discharge: 2015-02-12 | Disposition: A | Discharge status: Home / Self Care | Payer: PPO | Source: Ambulatory Visit | Attending: Obstetrics & Gynecology | Admitting: Obstetrics & Gynecology

## 2015-02-12 ENCOUNTER — Other Ambulatory Visit: Payer: Self-pay | Admitting: Obstetrics & Gynecology

## 2015-02-12 DIAGNOSIS — Z1151 Encounter for screening for human papillomavirus (HPV): Secondary | ICD-10-CM | POA: Insufficient documentation

## 2015-02-12 DIAGNOSIS — Z01419 Encounter for gynecological examination (general) (routine) without abnormal findings: Secondary | ICD-10-CM | POA: Insufficient documentation

## 2015-02-12 DIAGNOSIS — Z1231 Encounter for screening mammogram for malignant neoplasm of breast: Secondary | ICD-10-CM

## 2015-02-13 LAB — CYTOLOGY - PAP

## 2015-09-07 ENCOUNTER — Emergency Department (HOSPITAL_COMMUNITY)
Admission: EM | Admit: 2015-09-07 | Discharge: 2015-09-07 | Disposition: A | Discharge status: Home / Self Care | Payer: PPO | Attending: Emergency Medicine | Admitting: Emergency Medicine

## 2015-09-07 ENCOUNTER — Encounter (HOSPITAL_COMMUNITY): Payer: Self-pay | Admitting: Emergency Medicine

## 2015-09-07 DIAGNOSIS — J45909 Unspecified asthma, uncomplicated: Secondary | ICD-10-CM | POA: Diagnosis not present

## 2015-09-07 DIAGNOSIS — I10 Essential (primary) hypertension: Secondary | ICD-10-CM | POA: Diagnosis not present

## 2015-09-07 DIAGNOSIS — L509 Urticaria, unspecified: Secondary | ICD-10-CM | POA: Diagnosis not present

## 2015-09-07 DIAGNOSIS — Z91040 Latex allergy status: Secondary | ICD-10-CM | POA: Insufficient documentation

## 2015-09-07 DIAGNOSIS — T782XXA Anaphylactic shock, unspecified, initial encounter: Secondary | ICD-10-CM | POA: Diagnosis not present

## 2015-09-07 MED ORDER — EPINEPHRINE 0.3 MG/0.3ML IJ SOAJ
0.3000 mg | Freq: Once | INTRAMUSCULAR | 1 refills | Status: AC
Start: 2015-09-07 — End: 2015-09-07

## 2015-09-07 MED ORDER — METHYLPREDNISOLONE SODIUM SUCC 125 MG IJ SOLR
125.0000 mg | Freq: Once | INTRAMUSCULAR | Status: AC
  Administered 2015-09-07: 125 mg via INTRAVENOUS
  Filled 2015-09-07: qty 2

## 2015-09-07 MED ORDER — FAMOTIDINE 20 MG PO TABS
20.0000 mg | ORAL_TABLET | Freq: Once | ORAL | Status: AC
  Administered 2015-09-07: 20 mg via ORAL
  Filled 2015-09-07: qty 1

## 2015-09-07 MED ORDER — DIPHENHYDRAMINE HCL 25 MG PO CAPS
25.0000 mg | ORAL_CAPSULE | Freq: Once | ORAL | Status: AC
  Administered 2015-09-07: 25 mg via ORAL
  Filled 2015-09-07: qty 1

## 2015-09-07 MED ORDER — PREDNISONE 10 MG PO TABS: 40.0000 mg | ORAL_TABLET | Freq: Every day | ORAL | 0 refills | Status: AC

## 2015-09-07 MED ORDER — EPINEPHRINE 0.3 MG/0.3ML IJ SOAJ
0.3000 mg | Freq: Once | INTRAMUSCULAR | Status: AC
  Administered 2015-09-07: 0.3 mg via INTRAMUSCULAR
  Filled 2015-09-07: qty 0.3

## 2015-09-07 NOTE — ED Notes (Signed)
Brooke Marshall, friend- [phone]

## 2015-09-07 NOTE — ED Triage Notes (Signed)
Patient presents with generalized itchy rashes , hives , mild tongue swelling , states " throat is closing " , took 2 Benadryl tabs prior to arrival , airway intact / no stridor , respirations unlabored - O2 sat= 100%.

## 2015-09-07 NOTE — ED Provider Notes (Signed)
[facility] DEPT Provider Note   CSN: [id number] Arrival date & time: 09/07/15  1955  First Provider Contact:  None       History   Chief Complaint Chief Complaint  Patient presents with  . Urticaria  . Allergic Reaction    HPI Brooke Marshall is a 65 y.o. female.  The history is provided by the patient.  Urticaria  This is a new problem. The current episode started today. The problem occurs rarely. Associated symptoms include a rash. Pertinent negatives include no abdominal pain, anorexia, arthralgias, chest pain, chills, congestion, coughing, diaphoresis, fatigue, fever, headaches, nausea, sore throat, urinary symptoms or vomiting. Nothing aggravates the symptoms. Treatments tried: 2 benadryl 10 min prior to arrival. The treatment provided mild relief.  Allergic Reaction  Presenting symptoms: rash     Past Medical History:  Diagnosis Date  . Diverticular disease    tx with diet  . Fibromyalgia    does not limit mobility  . GERD (gastroesophageal reflux disease)    tx with protonix  . History of hiatal hernia     Patient Active Problem List   Diagnosis Date Noted  . MICROALBUMINURIA 11/26/2009  . VITAMIN D DEFICIENCY 12/02/2008  . CONSTIPATION 11/28/2008  . FATIGUE 11/28/2008  . ALLERGIC RHINITIS WITH CONJUNCTIVITIS 10/31/2008  . TINGLING 07/29/2008  . MICROSCOPIC HEMATURIA 11/29/2007  . KNEE PAIN, BILATERAL 11/29/2007  . POSTMENOPAUSAL STATUS 11/29/2007  . FIBROIDS, UTERUS 10/29/2007  . OBESITY 10/29/2007  . HYPERTENSION, BENIGN ESSENTIAL 10/29/2007  . 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 GERD 10/29/2007    Past Surgical History:  Procedure Laterality Date  . CESAREAN SECTION    . COLONOSCOPY WITH PROPOFOL N/A 11/04/2014   Procedure: COLONOSCOPY WITH PROPOFOL;  Surgeon: Garlan Fair, MD;  Location: [facility];  Service: Endoscopy;  Laterality: N/A;  . ECTOPIC PREGNANCY SURGERY    . EYE SURGERY     as a child     OB History    No data  available       Home Medications    Prior to Admission medications   Medication Sig Start Date End Date Taking? Authorizing Provider  acetaminophen (TYLENOL) 500 MG tablet Take 1,000 mg by mouth every 6 (six) hours as needed for mild pain.   Yes Historical Provider, MD  Ca Carbonate-Mag Hydroxide (ROLAIDS PO) Take 1 tablet by mouth every 2 (two) hours as needed (for heartburn).   Yes Historical Provider, MD  hydrochlorothiazide (HYDRODIURIL) 25 MG tablet Take 25 mg by mouth every morning.   Yes Historical Provider, MD  omeprazole (PRILOSEC) 40 MG capsule Take 40 mg by mouth daily. 09/12/14  Yes Historical Provider, MD  predniSONE (DELTASONE) 10 MG tablet Take 4 tablets (40 mg total) by mouth daily. 09/07/15   [name], MD    Family History No family history on file.  Social History Social History  Substance Use Topics  . Smoking status: Never Smoker  . Smokeless tobacco: Not on file  . Alcohol use 0.6 - 1.2 oz/week    1 - 2 Glasses of wine per week     Allergies   Other; Apple; Bee venom; Latex; Mosquito (diagnostic); and Peach [prunus persica]   Review of Systems Review of Systems  Constitutional: Negative for chills, diaphoresis, fatigue and fever.  HENT: Negative for congestion and sore throat.   Eyes: Negative for visual disturbance.  Respiratory: Negative for cough.   Cardiovascular: Negative for chest pain.  Gastrointestinal: Negative for abdominal pain, anorexia, nausea and vomiting.  Genitourinary: Negative for difficulty urinating and dysuria.  Musculoskeletal: Negative for arthralgias.  Skin: Positive for rash.  Neurological: Negative for headaches.  Psychiatric/Behavioral: Negative for agitation and confusion.     Physical Exam Updated Vital Signs BP 138/74 (BP Location: Right Arm)   Pulse 92   Temp 98.5 F (36.9 C) (Oral)   Resp 16   SpO2 99%   Physical Exam  Constitutional: She appears well-developed and well-nourished. She appears  distressed.  HENT:  Head: Normocephalic and atraumatic.  Eyes: Conjunctivae are normal.  Neck: Neck supple.  Cardiovascular: Normal rate and regular rhythm.   No murmur heard. Pulmonary/Chest: Breath sounds normal. She has no wheezes. She has no rales.  Increased work of breathing  Abdominal: Soft. There is no tenderness.  Musculoskeletal: She exhibits no edema.  Neurological: She is alert.  Skin: Skin is warm and dry. Rash noted. She is not diaphoretic.  Urticaria about the torso and bilateral upper extremities  Psychiatric: She has a normal mood and affect.  Appears anxious  Nursing note and vitals reviewed.    ED Treatments / Results  Labs (all labs ordered are listed, but only abnormal results are displayed) Labs Reviewed - No data to display  EKG  EKG Interpretation None       Radiology No results found.  Procedures Procedures (including critical care time)  Medications Ordered in ED Medications  diphenhydrAMINE (BENADRYL) capsule 25 mg (25 mg Oral Given 09/07/15 2023)  famotidine (PEPCID) tablet 20 mg (20 mg Oral Given 09/07/15 2023)  methylPREDNISolone sodium succinate (SOLU-MEDROL) 125 mg/2 mL injection 125 mg (125 mg Intravenous Given 09/07/15 2023)  EPINEPHrine (EPI-PEN) injection 0.3 mg (0.3 mg Intramuscular Given 09/07/15 2023)     Initial Impression / Assessment and Plan / ED Course  I have reviewed the triage vital signs and the nursing notes.  Pertinent labs & imaging results that were available during my care of the patient were reviewed by me and considered in my medical decision making (see chart for details).  Clinical Course   Patient presenting with diffuse rash and difficulty breathing concerning for anaphylaxis after ingestion of unknown allergen. She has never tried the dressing she ate tonight and had onset of symptoms shortly after eating it. On arrival she was given Benadryl, famotidine, methylprednisolone, and an EpiPen injection to the  right upper thigh. Symptoms resolved within 20-30 minutes and she was observed in the emergency department for another 3 hours. She had no further recurrence of symptoms and her vitals remained within normal limits throughout her stay. On reexamination patient states she has no further throat swelling or difficulty breathing and is ready to go home. She is advised to use Benadryl for any future itching in the next few days and was discharged home with an EpiPen and 4 days of prednisone. She was given strict precautions to return to the emergency department if any of these symptoms including nausea, vomiting, diarrhea, bloating, lightheadedness, difficulty breathing, or diffuse itchiness should occur again. She was also given a referral to the allergy clinic and instructed to follow up regarding exposure to this unknown allergen. She was discharged home in good condition.  Final Clinical Impressions(s) / ED Diagnoses   Final diagnoses:  Anaphylaxis, initial encounter    New Prescriptions Discharge Medication List as of 09/07/2015 11:02 PM    START taking these medications   Details  EPINEPHrine (EPIPEN 2-PAK) 0.3 mg/0.3 mL IJ SOAJ injection Inject 0.3 mLs (0.3 mg total) into the muscle once., Starting  Mon 09/07/2015, Print    predniSONE (DELTASONE) 10 MG tablet Take 4 tablets (40 mg total) by mouth daily., Starting Mon 09/07/2015, Print         [name], MD 09/08/15 0000    Ripley Fraise, MD 09/08/15 [phone]

## 2015-09-25 DIAGNOSIS — T781XXA Other adverse food reactions, not elsewhere classified, initial encounter: Secondary | ICD-10-CM | POA: Diagnosis not present

## 2016-01-04 DIAGNOSIS — M25569 Pain in unspecified knee: Secondary | ICD-10-CM | POA: Diagnosis not present

## 2016-01-04 DIAGNOSIS — K1379 Other lesions of oral mucosa: Secondary | ICD-10-CM | POA: Diagnosis not present

## 2016-01-05 DIAGNOSIS — M1712 Unilateral primary osteoarthritis, left knee: Secondary | ICD-10-CM | POA: Diagnosis not present

## 2016-01-05 DIAGNOSIS — M545 Low back pain: Secondary | ICD-10-CM | POA: Diagnosis not present

## 2016-01-05 DIAGNOSIS — M25552 Pain in left hip: Secondary | ICD-10-CM | POA: Diagnosis not present

## 2016-01-14 DIAGNOSIS — M1712 Unilateral primary osteoarthritis, left knee: Secondary | ICD-10-CM | POA: Diagnosis not present

## 2016-02-01 DIAGNOSIS — J3089 Other allergic rhinitis: Secondary | ICD-10-CM | POA: Diagnosis not present

## 2016-02-01 DIAGNOSIS — T781XXA Other adverse food reactions, not elsewhere classified, initial encounter: Secondary | ICD-10-CM | POA: Diagnosis not present

## 2016-02-01 DIAGNOSIS — J301 Allergic rhinitis due to pollen: Secondary | ICD-10-CM | POA: Diagnosis not present

## 2016-02-01 DIAGNOSIS — J3081 Allergic rhinitis due to animal (cat) (dog) hair and dander: Secondary | ICD-10-CM | POA: Diagnosis not present

## 2016-02-11 ENCOUNTER — Other Ambulatory Visit: Payer: Self-pay | Admitting: Internal Medicine

## 2016-02-11 DIAGNOSIS — Z1231 Encounter for screening mammogram for malignant neoplasm of breast: Secondary | ICD-10-CM

## 2016-03-07 DIAGNOSIS — M25511 Pain in right shoulder: Secondary | ICD-10-CM | POA: Diagnosis not present

## 2016-03-14 ENCOUNTER — Ambulatory Visit
Admission: RE | Admit: 2016-03-14 | Discharge: 2016-03-14 | Disposition: A | Discharge status: Home / Self Care | Payer: PPO | Source: Ambulatory Visit | Attending: Internal Medicine | Admitting: Internal Medicine

## 2016-03-14 DIAGNOSIS — Z1231 Encounter for screening mammogram for malignant neoplasm of breast: Secondary | ICD-10-CM

## 2016-03-22 DIAGNOSIS — M25511 Pain in right shoulder: Secondary | ICD-10-CM | POA: Diagnosis not present

## 2016-03-31 DIAGNOSIS — Z0000 Encounter for general adult medical examination without abnormal findings: Secondary | ICD-10-CM | POA: Diagnosis not present

## 2016-03-31 DIAGNOSIS — I10 Essential (primary) hypertension: Secondary | ICD-10-CM | POA: Diagnosis not present

## 2016-03-31 DIAGNOSIS — R7301 Impaired fasting glucose: Secondary | ICD-10-CM | POA: Diagnosis not present

## 2016-03-31 DIAGNOSIS — E663 Overweight: Secondary | ICD-10-CM | POA: Diagnosis not present

## 2016-03-31 DIAGNOSIS — Z800 Family history of malignant neoplasm of digestive organs: Secondary | ICD-10-CM | POA: Diagnosis not present

## 2016-03-31 DIAGNOSIS — Z1389 Encounter for screening for other disorder: Secondary | ICD-10-CM | POA: Diagnosis not present

## 2016-03-31 DIAGNOSIS — Z6841 Body Mass Index (BMI) 40.0 and over, adult: Secondary | ICD-10-CM | POA: Diagnosis not present

## 2016-04-07 DIAGNOSIS — H2513 Age-related nuclear cataract, bilateral: Secondary | ICD-10-CM | POA: Diagnosis not present

## 2016-04-07 DIAGNOSIS — H40033 Anatomical narrow angle, bilateral: Secondary | ICD-10-CM | POA: Diagnosis not present

## 2016-04-27 DIAGNOSIS — H02413 Mechanical ptosis of bilateral eyelids: Secondary | ICD-10-CM | POA: Diagnosis not present

## 2016-04-27 DIAGNOSIS — H02423 Myogenic ptosis of bilateral eyelids: Secondary | ICD-10-CM | POA: Diagnosis not present

## 2016-06-22 DIAGNOSIS — H02422 Myogenic ptosis of left eyelid: Secondary | ICD-10-CM | POA: Diagnosis not present

## 2016-06-22 DIAGNOSIS — H02413 Mechanical ptosis of bilateral eyelids: Secondary | ICD-10-CM | POA: Diagnosis not present

## 2016-06-22 DIAGNOSIS — H02421 Myogenic ptosis of right eyelid: Secondary | ICD-10-CM | POA: Diagnosis not present

## 2016-09-29 DIAGNOSIS — E2839 Other primary ovarian failure: Secondary | ICD-10-CM | POA: Diagnosis not present

## 2016-09-29 DIAGNOSIS — I10 Essential (primary) hypertension: Secondary | ICD-10-CM | POA: Diagnosis not present

## 2016-09-29 DIAGNOSIS — Z6841 Body Mass Index (BMI) 40.0 and over, adult: Secondary | ICD-10-CM | POA: Diagnosis not present

## 2016-09-29 DIAGNOSIS — R7301 Impaired fasting glucose: Secondary | ICD-10-CM | POA: Diagnosis not present

## 2016-09-29 DIAGNOSIS — E663 Overweight: Secondary | ICD-10-CM | POA: Diagnosis not present

## 2016-11-10 DIAGNOSIS — N760 Acute vaginitis: Secondary | ICD-10-CM | POA: Diagnosis not present

## 2016-11-10 DIAGNOSIS — E2839 Other primary ovarian failure: Secondary | ICD-10-CM | POA: Diagnosis not present

## 2016-11-10 DIAGNOSIS — Z6841 Body Mass Index (BMI) 40.0 and over, adult: Secondary | ICD-10-CM | POA: Diagnosis not present

## 2017-02-28 DIAGNOSIS — Z01419 Encounter for gynecological examination (general) (routine) without abnormal findings: Secondary | ICD-10-CM | POA: Diagnosis not present

## 2017-02-28 DIAGNOSIS — R351 Nocturia: Secondary | ICD-10-CM | POA: Diagnosis not present

## 2017-02-28 DIAGNOSIS — Z6841 Body Mass Index (BMI) 40.0 and over, adult: Secondary | ICD-10-CM | POA: Diagnosis not present

## 2017-03-07 ENCOUNTER — Other Ambulatory Visit: Payer: Self-pay | Admitting: Internal Medicine

## 2017-03-07 DIAGNOSIS — Z1231 Encounter for screening mammogram for malignant neoplasm of breast: Secondary | ICD-10-CM

## 2017-04-04 ENCOUNTER — Ambulatory Visit: Payer: PPO

## 2017-04-12 DIAGNOSIS — I10 Essential (primary) hypertension: Secondary | ICD-10-CM | POA: Diagnosis not present

## 2017-04-12 DIAGNOSIS — Z0000 Encounter for general adult medical examination without abnormal findings: Secondary | ICD-10-CM | POA: Diagnosis not present

## 2017-04-12 DIAGNOSIS — R109 Unspecified abdominal pain: Secondary | ICD-10-CM | POA: Diagnosis not present

## 2017-04-12 DIAGNOSIS — Z1389 Encounter for screening for other disorder: Secondary | ICD-10-CM | POA: Diagnosis not present

## 2017-04-12 DIAGNOSIS — R7301 Impaired fasting glucose: Secondary | ICD-10-CM | POA: Diagnosis not present

## 2017-04-12 DIAGNOSIS — Z23 Encounter for immunization: Secondary | ICD-10-CM | POA: Diagnosis not present

## 2017-04-13 ENCOUNTER — Other Ambulatory Visit: Payer: Self-pay | Admitting: Internal Medicine

## 2017-04-13 DIAGNOSIS — R109 Unspecified abdominal pain: Secondary | ICD-10-CM

## 2017-04-18 DIAGNOSIS — H1013 Acute atopic conjunctivitis, bilateral: Secondary | ICD-10-CM | POA: Diagnosis not present

## 2017-04-18 DIAGNOSIS — H40033 Anatomical narrow angle, bilateral: Secondary | ICD-10-CM | POA: Diagnosis not present

## 2017-04-20 ENCOUNTER — Ambulatory Visit
Admission: RE | Admit: 2017-04-20 | Discharge: 2017-04-20 | Disposition: A | Discharge status: Home / Self Care | Payer: PPO | Source: Ambulatory Visit | Attending: Internal Medicine | Admitting: Internal Medicine

## 2017-04-20 DIAGNOSIS — Z1231 Encounter for screening mammogram for malignant neoplasm of breast: Secondary | ICD-10-CM | POA: Diagnosis not present

## 2017-04-25 ENCOUNTER — Ambulatory Visit
Admission: RE | Admit: 2017-04-25 | Discharge: 2017-04-25 | Disposition: A | Discharge status: Home / Self Care | Payer: PPO | Source: Ambulatory Visit | Attending: Internal Medicine | Admitting: Internal Medicine

## 2017-04-25 DIAGNOSIS — R109 Unspecified abdominal pain: Secondary | ICD-10-CM

## 2017-04-25 DIAGNOSIS — R197 Diarrhea, unspecified: Secondary | ICD-10-CM | POA: Diagnosis not present

## 2017-04-26 DIAGNOSIS — R109 Unspecified abdominal pain: Secondary | ICD-10-CM | POA: Diagnosis not present

## 2017-10-10 DIAGNOSIS — R7301 Impaired fasting glucose: Secondary | ICD-10-CM | POA: Diagnosis not present

## 2017-10-10 DIAGNOSIS — I10 Essential (primary) hypertension: Secondary | ICD-10-CM | POA: Diagnosis not present

## 2017-10-10 DIAGNOSIS — Z6841 Body Mass Index (BMI) 40.0 and over, adult: Secondary | ICD-10-CM | POA: Diagnosis not present

## 2018-03-29 ENCOUNTER — Other Ambulatory Visit: Payer: Self-pay | Admitting: Internal Medicine

## 2018-03-29 DIAGNOSIS — Z1231 Encounter for screening mammogram for malignant neoplasm of breast: Secondary | ICD-10-CM

## 2018-04-25 ENCOUNTER — Other Ambulatory Visit: Payer: Self-pay

## 2018-04-25 ENCOUNTER — Ambulatory Visit
Admission: RE | Admit: 2018-04-25 | Discharge: 2018-04-25 | Disposition: A | Discharge status: Home / Self Care | Payer: PPO | Source: Ambulatory Visit | Attending: Internal Medicine | Admitting: Internal Medicine

## 2018-04-25 DIAGNOSIS — Z1231 Encounter for screening mammogram for malignant neoplasm of breast: Secondary | ICD-10-CM

## 2019-04-29 ENCOUNTER — Other Ambulatory Visit: Payer: Self-pay | Admitting: Internal Medicine

## 2019-04-29 DIAGNOSIS — Z1231 Encounter for screening mammogram for malignant neoplasm of breast: Secondary | ICD-10-CM

## 2019-06-05 ENCOUNTER — Other Ambulatory Visit: Payer: Self-pay

## 2019-06-05 ENCOUNTER — Ambulatory Visit
Admission: RE | Admit: 2019-06-05 | Discharge: 2019-06-05 | Disposition: A | Discharge status: Home / Self Care | Payer: Medicare Other | Source: Ambulatory Visit | Attending: Internal Medicine | Admitting: Internal Medicine

## 2019-06-05 DIAGNOSIS — Z1231 Encounter for screening mammogram for malignant neoplasm of breast: Secondary | ICD-10-CM

## 2020-05-07 ENCOUNTER — Other Ambulatory Visit: Payer: Self-pay | Admitting: Internal Medicine

## 2020-05-07 DIAGNOSIS — Z1231 Encounter for screening mammogram for malignant neoplasm of breast: Secondary | ICD-10-CM

## 2020-05-27 DIAGNOSIS — Z0000 Encounter for general adult medical examination without abnormal findings: Secondary | ICD-10-CM | POA: Diagnosis not present

## 2020-05-27 DIAGNOSIS — I10 Essential (primary) hypertension: Secondary | ICD-10-CM | POA: Diagnosis not present

## 2020-05-27 DIAGNOSIS — R7309 Other abnormal glucose: Secondary | ICD-10-CM | POA: Diagnosis not present

## 2020-07-07 ENCOUNTER — Ambulatory Visit: Payer: Medicare Other

## 2020-09-03 DIAGNOSIS — R1012 Left upper quadrant pain: Secondary | ICD-10-CM | POA: Diagnosis not present

## 2020-09-07 ENCOUNTER — Other Ambulatory Visit: Payer: Self-pay | Admitting: Nurse Practitioner

## 2020-09-07 DIAGNOSIS — R102 Pelvic and perineal pain: Secondary | ICD-10-CM

## 2020-09-18 ENCOUNTER — Other Ambulatory Visit: Payer: Self-pay

## 2020-09-18 ENCOUNTER — Ambulatory Visit
Admission: RE | Admit: 2020-09-18 | Discharge: 2020-09-18 | Disposition: A | Discharge status: Home / Self Care | Payer: Medicare Other | Source: Ambulatory Visit | Attending: Nurse Practitioner | Admitting: Nurse Practitioner

## 2020-09-18 DIAGNOSIS — R102 Pelvic and perineal pain: Secondary | ICD-10-CM

## 2020-09-30 ENCOUNTER — Ambulatory Visit
Admission: RE | Admit: 2020-09-30 | Discharge: 2020-09-30 | Disposition: A | Discharge status: Home / Self Care | Payer: Medicare Other | Source: Ambulatory Visit | Attending: Internal Medicine | Admitting: Internal Medicine

## 2020-09-30 ENCOUNTER — Other Ambulatory Visit: Payer: Self-pay

## 2020-09-30 DIAGNOSIS — Z1231 Encounter for screening mammogram for malignant neoplasm of breast: Secondary | ICD-10-CM | POA: Diagnosis not present

## 2020-10-20 DIAGNOSIS — D219 Benign neoplasm of connective and other soft tissue, unspecified: Secondary | ICD-10-CM | POA: Diagnosis not present

## 2020-12-02 DIAGNOSIS — R7301 Impaired fasting glucose: Secondary | ICD-10-CM | POA: Diagnosis not present

## 2020-12-02 DIAGNOSIS — E7800 Pure hypercholesterolemia, unspecified: Secondary | ICD-10-CM | POA: Diagnosis not present

## 2020-12-02 DIAGNOSIS — I10 Essential (primary) hypertension: Secondary | ICD-10-CM | POA: Diagnosis not present

## 2020-12-02 DIAGNOSIS — R7309 Other abnormal glucose: Secondary | ICD-10-CM | POA: Diagnosis not present

## 2021-01-13 ENCOUNTER — Ambulatory Visit (HOSPITAL_BASED_OUTPATIENT_CLINIC_OR_DEPARTMENT_OTHER): Admit: 2021-01-13 | Payer: Medicare Other | Admitting: Obstetrics and Gynecology

## 2021-01-13 ENCOUNTER — Encounter (HOSPITAL_BASED_OUTPATIENT_CLINIC_OR_DEPARTMENT_OTHER): Payer: Self-pay

## 2021-01-13 SURGERY — SALPINGO-OOPHORECTOMY, BILATERAL, LAPAROSCOPIC
Anesthesia: Choice | Laterality: Bilateral

## 2021-06-01 DIAGNOSIS — Z1159 Encounter for screening for other viral diseases: Secondary | ICD-10-CM | POA: Diagnosis not present

## 2021-06-01 DIAGNOSIS — M1711 Unilateral primary osteoarthritis, right knee: Secondary | ICD-10-CM | POA: Diagnosis not present

## 2021-06-01 DIAGNOSIS — E7800 Pure hypercholesterolemia, unspecified: Secondary | ICD-10-CM | POA: Diagnosis not present

## 2021-06-01 DIAGNOSIS — G720 Drug-induced myopathy: Secondary | ICD-10-CM | POA: Diagnosis not present

## 2021-06-01 DIAGNOSIS — Z5181 Encounter for therapeutic drug level monitoring: Secondary | ICD-10-CM | POA: Diagnosis not present

## 2021-06-01 DIAGNOSIS — Z1389 Encounter for screening for other disorder: Secondary | ICD-10-CM | POA: Diagnosis not present

## 2021-06-01 DIAGNOSIS — I10 Essential (primary) hypertension: Secondary | ICD-10-CM | POA: Diagnosis not present

## 2021-06-01 DIAGNOSIS — Z0000 Encounter for general adult medical examination without abnormal findings: Secondary | ICD-10-CM | POA: Diagnosis not present

## 2021-06-01 DIAGNOSIS — R7301 Impaired fasting glucose: Secondary | ICD-10-CM | POA: Diagnosis not present

## 2021-09-01 ENCOUNTER — Other Ambulatory Visit: Payer: Self-pay | Admitting: Internal Medicine

## 2021-09-01 DIAGNOSIS — Z1231 Encounter for screening mammogram for malignant neoplasm of breast: Secondary | ICD-10-CM

## 2021-09-06 DIAGNOSIS — Z780 Asymptomatic menopausal state: Secondary | ICD-10-CM | POA: Diagnosis not present

## 2021-09-07 ENCOUNTER — Other Ambulatory Visit: Payer: Self-pay | Admitting: Nurse Practitioner

## 2021-09-07 DIAGNOSIS — Z780 Asymptomatic menopausal state: Secondary | ICD-10-CM

## 2021-10-05 ENCOUNTER — Ambulatory Visit: Payer: Medicare Other

## 2021-10-07 ENCOUNTER — Ambulatory Visit
Admission: RE | Admit: 2021-10-07 | Discharge: 2021-10-07 | Disposition: A | Discharge status: Home / Self Care | Payer: Medicare Other | Source: Ambulatory Visit | Attending: Internal Medicine | Admitting: Internal Medicine

## 2021-10-07 ENCOUNTER — Ambulatory Visit
Admission: RE | Admit: 2021-10-07 | Discharge: 2021-10-07 | Disposition: A | Discharge status: Home / Self Care | Payer: Medicare Other | Source: Ambulatory Visit | Attending: Nurse Practitioner | Admitting: Nurse Practitioner

## 2021-10-07 DIAGNOSIS — Z1231 Encounter for screening mammogram for malignant neoplasm of breast: Secondary | ICD-10-CM | POA: Diagnosis not present

## 2021-10-07 DIAGNOSIS — Z780 Asymptomatic menopausal state: Secondary | ICD-10-CM

## 2021-11-29 DIAGNOSIS — I10 Essential (primary) hypertension: Secondary | ICD-10-CM | POA: Diagnosis not present

## 2021-11-29 DIAGNOSIS — R7301 Impaired fasting glucose: Secondary | ICD-10-CM | POA: Diagnosis not present

## 2021-11-29 DIAGNOSIS — E7800 Pure hypercholesterolemia, unspecified: Secondary | ICD-10-CM | POA: Diagnosis not present

## 2021-11-29 DIAGNOSIS — R197 Diarrhea, unspecified: Secondary | ICD-10-CM | POA: Diagnosis not present

## 2021-12-17 IMAGING — MG MM DIGITAL SCREENING BILAT W/ TOMO AND CAD
8 of 14 series · 8 of 40 positions shown · non-contrast
Comparison: Previous exam(s).

ACR Breast Density Category a: The breast tissue is almost entirely
fatty.

CLINICAL DATA: Screening.

EXAM:
DIGITAL SCREENING BILATERAL MAMMOGRAM WITH TOMOSYNTHESIS AND CAD
TECHNIQUE: Bilateral screening digital craniocaudal and mediolateral oblique
mammograms were obtained. Bilateral screening digital breast
tomosynthesis was performed. The images were evaluated with
computer-aided detection.

[L MLO synth-2D (1 of 2)]
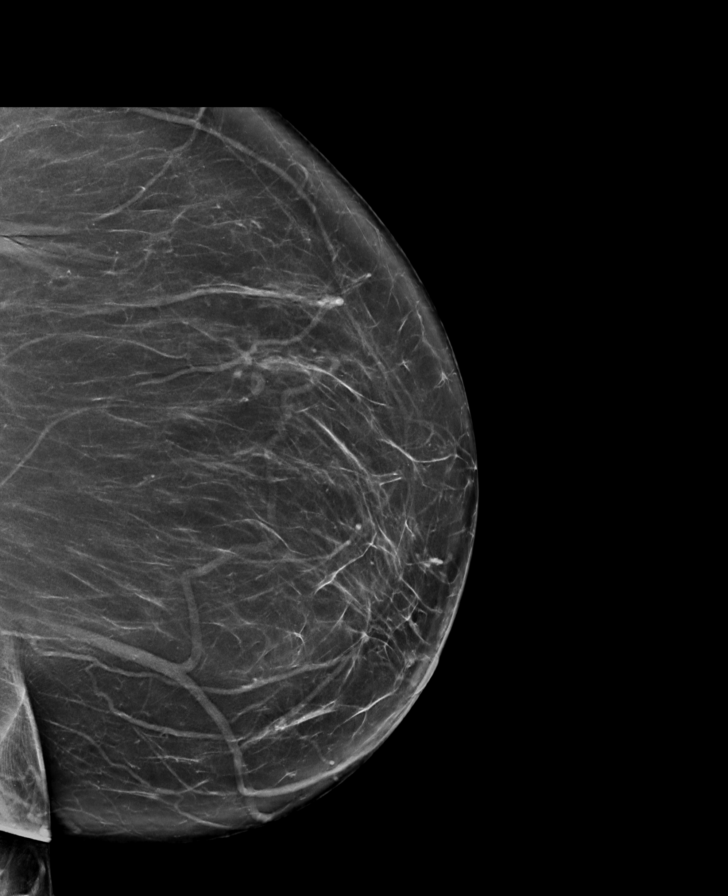

[R MLO synth-2D (1 of 2)]
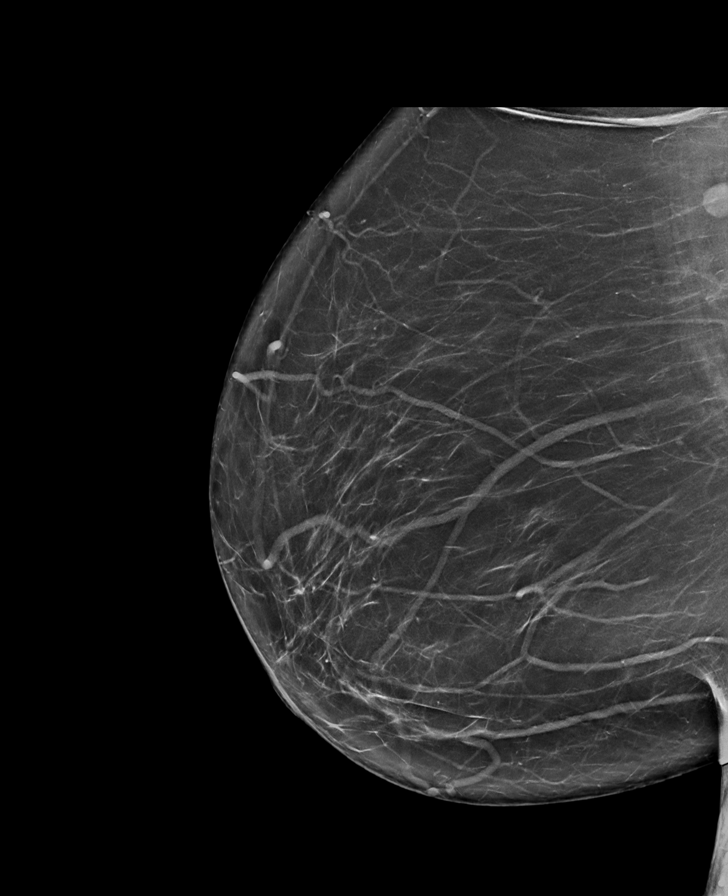

[L CC synth-2D]
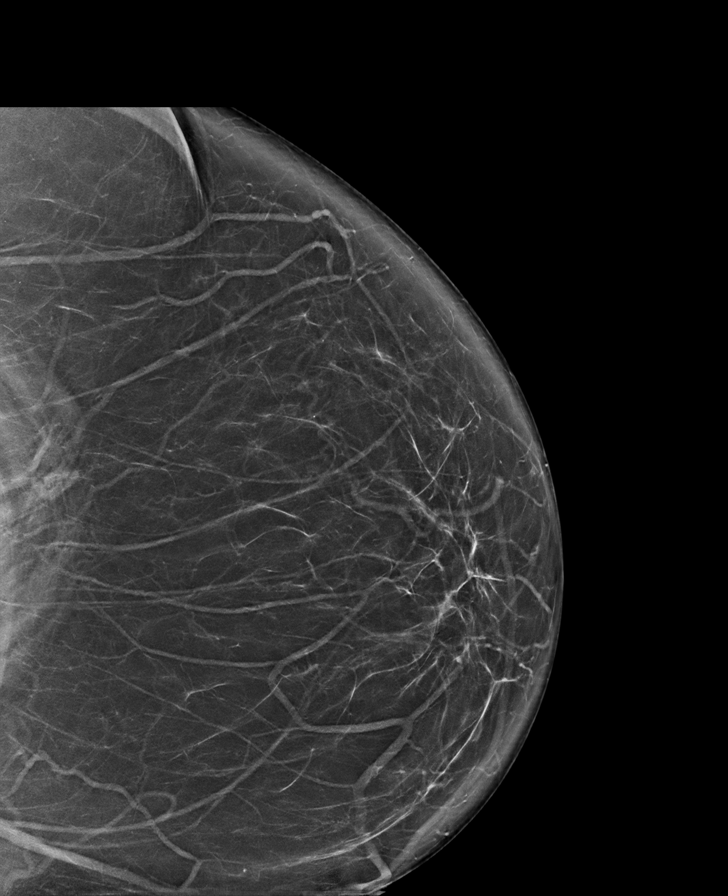

[R MLO synth-2D (2 of 2)]
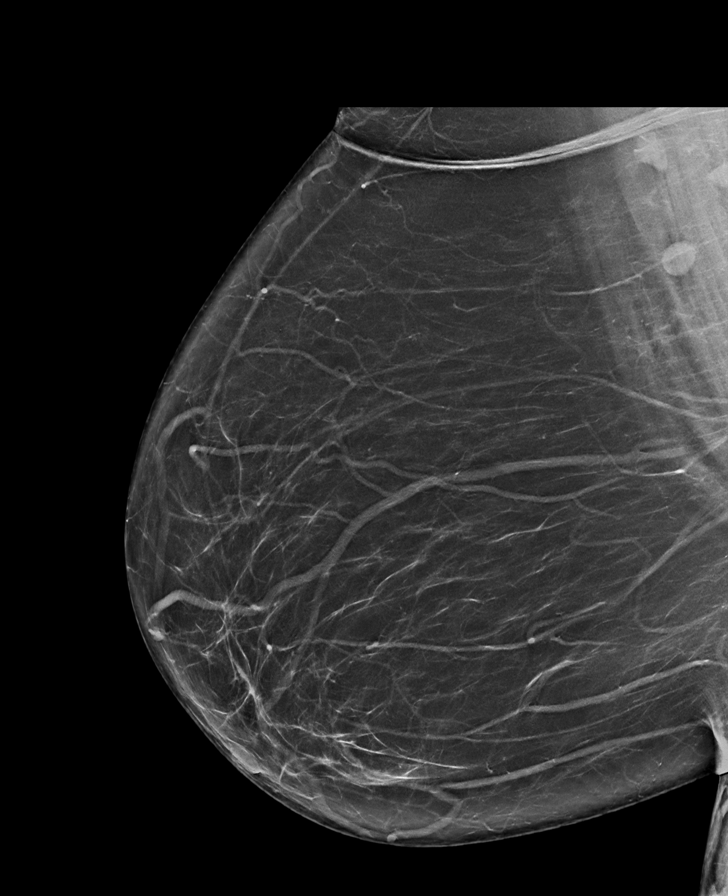

[L MLO synth-2D (2 of 2)]
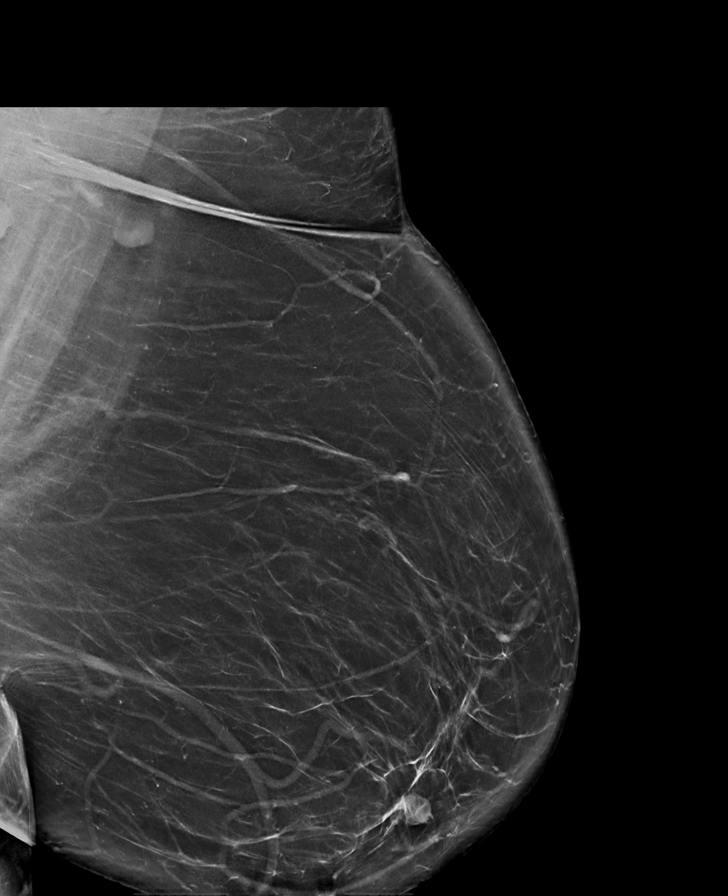

[R CC synth-2D]
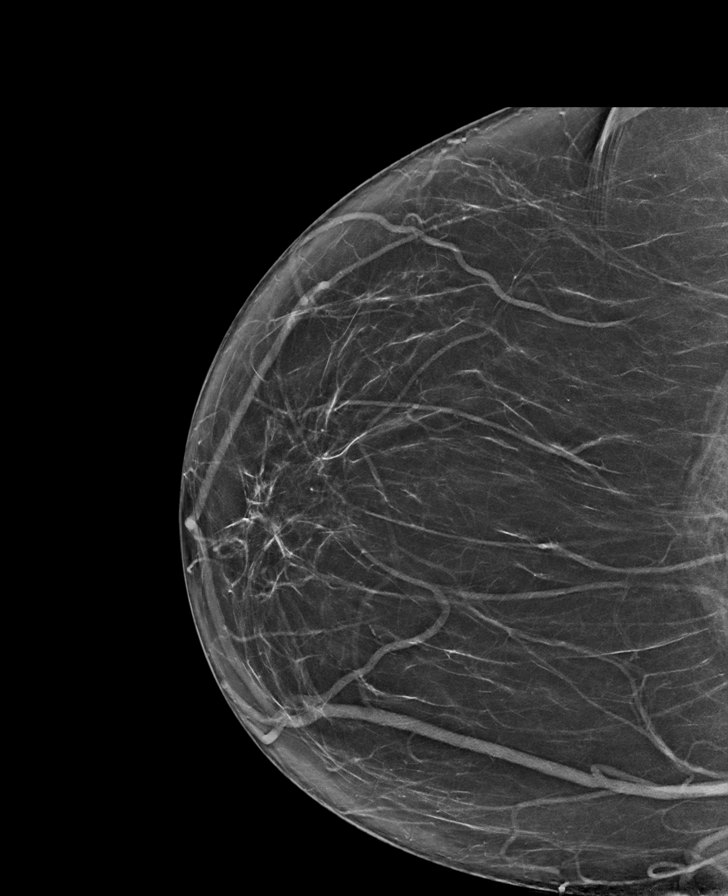

[R CV synth-2D]
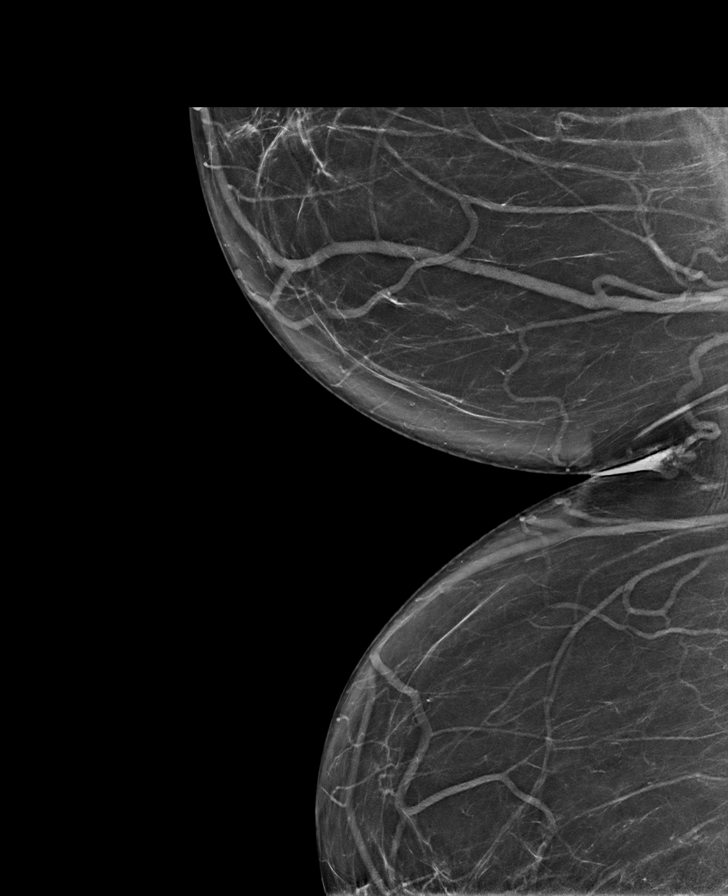

[L MLO tomo · tomo slice 44/87.0]
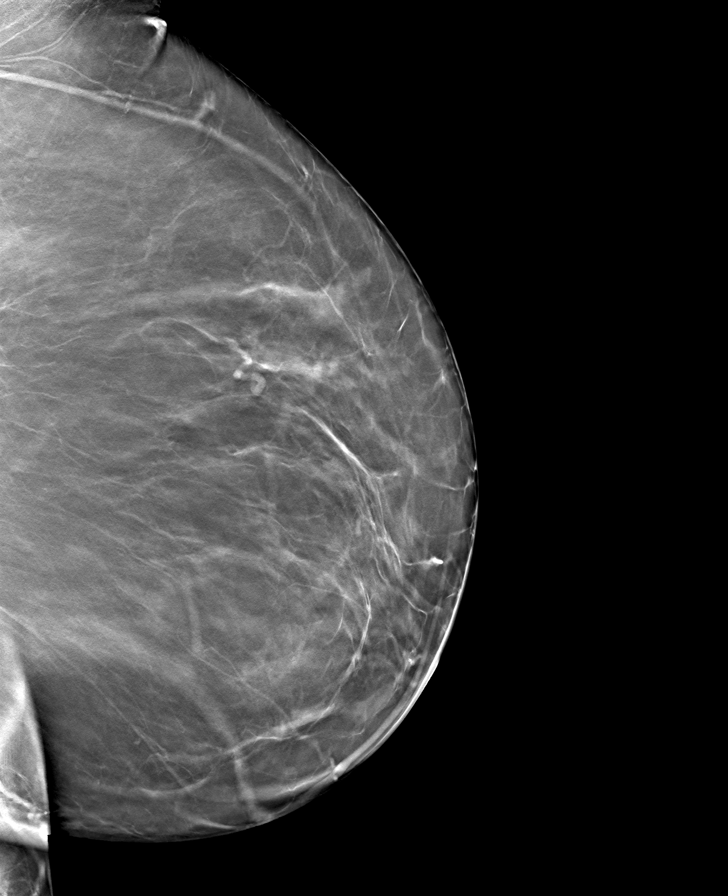

[8 of 40 positions shown; findings below may reference images not displayed]

FINDINGS: There are no findings suspicious for malignancy.
IMPRESSION: No mammographic evidence of malignancy. A result letter of this
screening mammogram will be mailed directly to the patient.

RECOMMENDATION:
Screening mammogram in one year. (Code:0E-3-N98)

BI-RADS CATEGORY  1: Negative.

## 2022-02-18 ENCOUNTER — Other Ambulatory Visit: Payer: Medicare Other

## 2022-03-16 ENCOUNTER — Emergency Department (HOSPITAL_BASED_OUTPATIENT_CLINIC_OR_DEPARTMENT_OTHER): Payer: 59 | Admitting: Radiology

## 2022-03-16 ENCOUNTER — Emergency Department (HOSPITAL_BASED_OUTPATIENT_CLINIC_OR_DEPARTMENT_OTHER)
Admission: EM | Admit: 2022-03-16 | Discharge: 2022-03-16 | Disposition: A | Discharge status: Home / Self Care | Payer: 59 | Attending: Emergency Medicine | Admitting: Emergency Medicine

## 2022-03-16 ENCOUNTER — Other Ambulatory Visit: Payer: Self-pay

## 2022-03-16 DIAGNOSIS — R030 Elevated blood-pressure reading, without diagnosis of hypertension: Secondary | ICD-10-CM | POA: Diagnosis not present

## 2022-03-16 DIAGNOSIS — R079 Chest pain, unspecified: Secondary | ICD-10-CM

## 2022-03-16 DIAGNOSIS — Z79899 Other long term (current) drug therapy: Secondary | ICD-10-CM | POA: Diagnosis not present

## 2022-03-16 DIAGNOSIS — I10 Essential (primary) hypertension: Secondary | ICD-10-CM | POA: Diagnosis not present

## 2022-03-16 DIAGNOSIS — Z91040 Latex allergy status: Secondary | ICD-10-CM | POA: Insufficient documentation

## 2022-03-16 LAB — BASIC METABOLIC PANEL
Anion gap: 13 (ref 5–15)
BUN: 13 mg/dL (ref 8–23)
CO2: 23 mmol/L (ref 22–32)
Calcium: 10.9 mg/dL — ABNORMAL HIGH (ref 8.9–10.3)
Chloride: 104 mmol/L (ref 98–111)
Creatinine, Ser: 1.09 mg/dL — ABNORMAL HIGH (ref 0.44–1.00)
GFR, Estimated: 54 mL/min — ABNORMAL LOW (ref >60)
Glucose, Bld: 124 mg/dL — ABNORMAL HIGH (ref 70–99)
Potassium: 4.1 mmol/L (ref 3.5–5.1)
Sodium: 140 mmol/L (ref 135–145)

## 2022-03-16 LAB — CBC
HCT: 41.6 % (ref 36.0–46.0)
Hemoglobin: 13.7 g/dL (ref 12.0–15.0)
MCH: 30.3 pg (ref 26.0–34.0)
MCHC: 32.9 g/dL (ref 30.0–36.0)
MCV: 92 fL (ref 80.0–100.0)
Platelets: 165 10*3/uL (ref 150–400)
RBC: 4.52 MIL/uL (ref 3.87–5.11)
RDW: 13.7 % (ref 11.5–15.5)
WBC: 7.4 10*3/uL (ref 4.0–10.5)
nRBC: 0 % (ref 0.0–0.2)

## 2022-03-16 LAB — TROPONIN I (HIGH SENSITIVITY)
Troponin I (High Sensitivity): 5 ng/L (ref <18)
Troponin I (High Sensitivity): 5 ng/L (ref <18)

## 2022-03-16 NOTE — ED Triage Notes (Signed)
Arrives POV. Reports HTN today with left sided chest pressure- seen at [facility] and sent here.  Non radiating. Denies SOB.  Aox4. Ambulatory. Family at bedside.

## 2022-03-16 NOTE — ED Notes (Signed)
Troponin redrawn  

## 2022-03-16 NOTE — ED Provider Notes (Signed)
[facility] Provider Note   CSN: [id number] Arrival date & time: 03/16/22  1528     History Chief Complaint  Patient presents with   Chest Pain    Brooke Marshall is a 72 y.o. female.  The emergency department complaints of chest pain and hypertension.  Patient reports that she woke up this morning with her blood pressure elevated on multiple readings with systolic pressure above 035.  She became concerned so she went into the urgent care clinic to her primary care provider's office and was advised to come to the emergency department as you are concerned about possible chest pain associated with hypertension.  Patient reports that she had an EKG done at urgent care which was reassuring without evidence of any ST elevation.  Patient reports he is generally in good health and only takes losartan-HCTZ for hypertension and omeprazole for acid reflux.  She did report that she felt like she may be ate something that was little bit difficult on her stomach a few days ago which caused some increased indigestion and an increase in her blood pressure.   Chest Pain      Home Medications Prior to Admission medications   Medication Sig Start Date End Date Taking? Authorizing Provider  acetaminophen (TYLENOL) 500 MG tablet Take 1,000 mg by mouth every 6 (six) hours as needed for mild pain.    [provider]  Ca Carbonate-Mag Hydroxide (ROLAIDS PO) Take 1 tablet by mouth every 2 (two) hours as needed (for heartburn).    [provider]  hydrochlorothiazide (HYDRODIURIL) 25 MG tablet Take 25 mg by mouth every morning.    [provider]  omeprazole (PRILOSEC) 40 MG capsule Take 40 mg by mouth daily. 09/12/14   [provider]  predniSONE (DELTASONE) 10 MG tablet Take 4 tablets (40 mg total) by mouth daily. 09/07/15   Brooke Bossier, MD      Allergies    Other, Apple juice, Bee venom, Latex, Mosquito (diagnostic), and Peach  [prunus persica]    Review of Systems   Review of Systems  Cardiovascular:  Positive for chest pain.    Physical Exam Updated Vital Signs BP (!) 151/89   Pulse 65   Temp 97.7 F (36.5 C) (Oral)   Resp 14   Ht '5\' 5"'$  (1.651 m)   Wt 131.1 kg   SpO2 100%   BMI 48.09 kg/m  Physical Exam  ED Results / Procedures / Treatments   Labs (all labs ordered are listed, but only abnormal results are displayed) Labs Reviewed  BASIC METABOLIC PANEL - Abnormal; Notable for the following components:      Result Value   Glucose, Bld 124 (*)    Creatinine, Ser 1.09 (*)    Calcium 10.9 (*)    GFR, Estimated 54 (*)    All other components within normal limits  CBC  TROPONIN I (HIGH SENSITIVITY)  TROPONIN I (HIGH SENSITIVITY)    EKG EKG Interpretation  Date/Time:  Wednesday March 16 2022 15:41:44 EST Ventricular Rate:  96 PR Interval:  140 QRS Duration: 87 QT Interval:  353 QTC Calculation: 447 R Axis:   -2 Text Interpretation: Sinus rhythm Atrial premature complex Left ventricular hypertrophy Borderline T abnormalities, lateral leads Confirmed by Regan Lemming ([id number]) on 03/16/2022 4:14:52 PM  Radiology No results found.  Procedures Procedures   Medications Ordered in ED Medications - No data to display  ED Course/ Medical Decision Making/ A&P Clinical Course as of  03/21/22 1236  Wed Mar 16, 2022  1947 Troponin I (High Sensitivity) [OZ]    Clinical Course User Index [OZ] Luvenia Heller, PA-C                            Medical Decision Making Amount and/or Complexity of Data Reviewed Labs: ordered. Decision-making details documented in ED Course. Radiology: ordered.   This patient presents to the ED for concern of hypertension. Differential diagnosis includes hypertensive emergency, ACS, acid reflux, costochondritis, viral URI    Lab Tests:  I Ordered, and personally interpreted labs.  The pertinent results include:  Negative troponin, CBC, and BMP. Repeat  troponin not noted in workup but no change from initial troponin at 5.   Imaging Studies ordered:  I ordered imaging studies including chest xray  I independently visualized and interpreted imaging which showed no cardiopulmonary disease I agree with the radiologist interpretation   Medicines ordered and prescription drug management:  I have reviewed the patients home medicines and have made adjustments as needed   Problem List / ED Course:  Patient presented to the emergency department complaints of chest pain hypertension and began this morning.  She reports that her blood pressure this morning had a systolic reading above 035 prior to her taking her blood pressure medications.  She was seen at the urgent care affiliated with her primary care provider and was advised to come to the emergency department as her blood pressure was elevated but EKG was normal there. During duration of patient visit to ED, she did not complain of chest pain but stated multiple times that her concern was due to her elevated BP. Patient's blood pressure slowly returned to baseline during the course of her workup and patient denied any chest pain, shortness of breath, nausea, diaphoresis, or generalized weakness. Given that patient's workup was normal and labs did not point to any acute cause of her symptoms, advised patient that she was safe to discharge home and follow up with PCP for further BP management. Also advised patient that she would be referred to cardiology for further evaluation with a stress test to ensure cardiac function is within normal limits. Patient was agreeable with treatment plan and verbalized understanding return precautions. All questions answered prior to patient discharge. Given patient had normal EKG, negative troponins, and negative chest xray, low concerns for STEMI, NSTEMI, pneumonia. Patient's lack of fever and respiratory symptoms all make viral URI less likely. Plan is to follow-up  outpatient with cardiology for stress testing.  Final Clinical Impression(s) / ED Diagnoses Final diagnoses:  Chest pain, unspecified type  Primary hypertension    Rx / DC Orders ED Discharge Orders          Ordered    Ambulatory referral to Cardiology       Comments: If you have not heard from the Cardiology office within the next 72 hours please call [phone].   03/16/22 2046              Luvenia Heller, PA-C 03/21/22 1236    Regan Lemming, MD 03/22/22 [phone]

## 2022-03-16 NOTE — Discharge Instructions (Addendum)
You are seen in the emergency department today for chest pain and hypertension.  All of your labs were reassuring without any obvious signs of a heart attack.  Patient plan to follow-up with her primary care provider to evaluate your hypertension and consider possible addition of other medications.  Blood pressure was able to come down while you are here in the emergency department so no other medications given to to treat the hypertension.  If you experience significant chest pain, shortness of breath you should plan to return to the emergency department for further evaluation.  I have also attached information for you to follow up with cardiology for a stress test.

## 2022-03-16 NOTE — ED Notes (Signed)
DC papers reviewed. No questions or concerns. No signs of distress. Pt assisted to wheelchair and out to lobby. Appropriate measures for safety taken. 

## 2022-04-06 ENCOUNTER — Ambulatory Visit: Payer: 59 | Attending: Interventional Cardiology | Admitting: Interventional Cardiology

## 2022-04-06 ENCOUNTER — Encounter: Payer: Self-pay | Admitting: Interventional Cardiology

## 2022-04-06 VITALS — BP 144/84 | HR 75 | Ht 65.0 in | Wt 282.0 lb

## 2022-04-06 DIAGNOSIS — R072 Precordial pain: Secondary | ICD-10-CM

## 2022-04-06 DIAGNOSIS — R7303 Prediabetes: Secondary | ICD-10-CM

## 2022-04-06 DIAGNOSIS — I10 Essential (primary) hypertension: Secondary | ICD-10-CM

## 2022-04-06 MED ORDER — METOPROLOL TARTRATE 100 MG PO TABS: ORAL_TABLET | ORAL | Status: AC

## 2022-04-06 NOTE — Patient Instructions (Addendum)
Medication Instructions:  Your physician recommends that you continue on your current medications as directed. Please refer to the Current Medication list given to you today.  *If you need a refill on your cardiac medications before your next appointment, please call your pharmacy*  Lab Work: If you have labs (blood work) drawn today and your tests are completely normal, you will receive your results only by: [place] (if you have MyChart) OR A paper copy in the mail If you have any lab test that is abnormal or we need to change your treatment, we will call you to review the results.  Testing/Procedures: Your physician has requested that you have cardiac CT. Cardiac computed tomography (CT) is a painless test that uses an x-ray machine to take clear, detailed pictures of your heart. For further information please visit [email or web address]. Please follow instruction sheet as given.  Follow-Up: At [facility], you and your health needs are our priority.  As part of our continuing mission to provide you with exceptional heart care, we have created designated Provider Care Teams.  These Care Teams include your primary Cardiologist (physician) and Advanced Practice Providers (APPs -  Physician Assistants and Nurse Practitioners) who all work together to provide you with the care you need, when you need it.  We recommend signing up for the patient portal called "MyChart".  Sign up information is provided on this After Visit Summary.  MyChart is used to connect with patients for Virtual Visits (Telemedicine).  Patients are able to view lab/test results, encounter notes, upcoming appointments, etc.  Non-urgent messages can be sent to your provider as well.   To learn more about what you can do with MyChart, go to [email or web address].    Your next appointment:   As needed per testing results.  Provider:   Larae Grooms, MD     Other Instructions   Your cardiac CT will be  scheduled at one of the below locations:   [facility] [address] [place], [place] [address] [phone]   If scheduled at [facility], please arrive at the [facility] and Children's Entrance (Entrance C2) of [facility] 30 minutes prior to test start time. You can use the FREE valet parking offered at entrance C (encouraged to control the heart rate for the test)  Proceed to the [facility] Radiology Department (first floor) to check-in and test prep.  All radiology patients and guests should use entrance C2 at [facility], accessed from [facility], even though the hospital's physical address listed is [address]      Please follow these instructions carefully (unless otherwise directed):  On the Night Before the Test: Be sure to Drink plenty of water. Do not consume any caffeinated/decaffeinated beverages or chocolate 12 hours prior to your test. Do not take any antihistamines 12 hours prior to your test.  On the Day of the Test: Drink plenty of water until 1 hour prior to the test. Do not eat any food 1 hour prior to test. You may take your regular medications prior to the test.  Take metoprolol (Lopressor) 100 mg two hours prior to test. If you take Losartan- Hydrochlorothiazide please HOLD on the morning of the test. FEMALES- please wear underwire-free bra if available, avoid dresses & tight clothing  After the Test: Drink plenty of water. After receiving IV contrast, you may experience a mild flushed feeling. This is normal. On occasion, you may experience a mild rash  up to 24 hours after the test. This is not dangerous. If this occurs, you can take Benadryl 25 mg and increase your fluid intake. If you experience trouble breathing, this can be serious. If it is severe call 911 IMMEDIATELY. If it is mild, please call our office.   We will call to schedule your test 2-4 weeks out understanding that some  insurance companies will need an authorization prior to the service being performed.   For non-scheduling related questions, please contact the cardiac imaging nurse navigator should you have any questions/concerns: Marchia Bond, Cardiac Imaging Nurse Navigator Gordy Clement, Cardiac Imaging Nurse Navigator [facility] Direct Office Dial: [phone]   For scheduling needs, including cancellations and rescheduling, please call [place], [phone].

## 2022-04-06 NOTE — Progress Notes (Signed)
Cardiology Office Note   Date:  04/06/2022   ID:  Brooke Marshall, DOB October 26, 1950, MRN [id number]  PCP:  Seward Carol, MD    No chief complaint on file.  Chest pain  Wt Readings from Last 3 Encounters:  04/06/22 282 lb (127.9 kg)  03/16/22 289 lb (131.1 kg)  11/04/14 274 lb (124.3 kg)       History of Present Illness: Brooke Marshall is a 72 y.o. female who is being seen today for the evaluation of chest pain at the request of Luvenia Heller, PA-C.   She went to the emergency room in January 2024.  Records show that she had: "complaints of chest pain and hypertension.  Patient reports that she woke up this morning with her blood pressure elevated on multiple readings with systolic pressure above [id number].  She became concerned so she went into the urgent care clinic to her primary care provider's office and was advised to come to the emergency department as you are concerned about possible chest pain associated with hypertension.  Patient reports that she had an EKG done at urgent care which was reassuring without evidence of any ST elevation.  Patient reports he is generally in good health and only takes losartan-HCTZ for hypertension and omeprazole for acid reflux.  She did report that she felt like she may be ate something that was little bit difficult on her stomach a few days ago which caused some increased indigestion and an increase in her blood pressure.    During duration of patient visit to ED, she did not complain of chest pain but stated multiple times that her concern was due to her elevated BP. Patient's blood pressure slowly returned to baseline during the course of her workup and patient denied any chest pain, shortness of breath, nausea, diaphoresis, or generalized weakness. Given that patient's workup was normal and labs did not point to any acute cause of her symptoms, advised patient that she was safe to discharge home and follow up with PCP for further BP management. Also  advised patient that she would be referred to cardiology for further evaluation with a stress test to ensure cardiac function is within normal limits. "  In Feb 2024, she has had some chest discomfort when eating a hamburger, and a hot dog.  Checks BP at home on occasion.  Has had readings in the 150s at home.    Past Medical History:  Diagnosis Date   Chest pain    Diverticular disease    tx with diet   Fibromyalgia    does not limit mobility   GERD (gastroesophageal reflux disease)    tx with protonix   History of hiatal hernia     Past Surgical History:  Procedure Laterality Date   CESAREAN SECTION     COLONOSCOPY WITH PROPOFOL N/A 11/04/2014   Procedure: COLONOSCOPY WITH PROPOFOL;  Surgeon: Garlan Fair, MD;  Location: [facility];  Service: Endoscopy;  Laterality: N/A;   ECTOPIC PREGNANCY SURGERY     EYE SURGERY     as a child      Current Outpatient Medications  Medication Sig Dispense Refill   acetaminophen (TYLENOL) 500 MG tablet Take 1,000 mg by mouth every 6 (six) hours as needed for mild pain.     ascorbic acid (VITAMIN C) 500 MG tablet 1 tablet Orally Once a day for 30 day(s)     Biotin 5000 MCG CAPS 1 capsule Orally Once a day  Ca Carbonate-Mag Hydroxide (ROLAIDS PO) Take 1 tablet by mouth every 2 (two) hours as needed (for heartburn).     Cholecalciferol 50 MCG (2000 UT) CAPS 1 capsule Orally Once a day for 30 day(s)     diphenhydrAMINE (BENADRYL CHILDRENS ALLERGY) 12.5 MG/5ML liquid 10 ml as needed Orally every 8 hrs     EPINEPHrine (EPIPEN 2-PAK) 0.3 mg/0.3 mL IJ SOAJ injection as directed Injection     losartan-hydrochlorothiazide (HYZAAR) 50-12.5 MG tablet TAKE 1 TABLET BY MOUTH EVERY DAY for 90     Magnesium 300 MG CAPS 1 capsule with a meal Orally Once a day for 30 day(s)     meloxicam (MOBIC) 15 MG tablet 1 tablet Orally Once a day as needed for 30 day(s)     omeprazole (PRILOSEC) 40 MG capsule Take 40 mg by mouth daily.  2   Pediatric  Multivitamins-Fl (MULTIVITAMIN + FLUORIDE) 0.25 MG CHEW 1 tablet by mouth Once daily     Potassium 99 MG TABS 1 tablet Orally Once a day for 30 day(s)     predniSONE (DELTASONE) 10 MG tablet Take 4 tablets (40 mg total) by mouth daily. 16 tablet 0   Zinc 50 MG TABS 1 tablet Orally Once a day     No current facility-administered medications for this visit.    Allergies:   Other, Apple juice, Bee venom, Latex, Mosquito (diagnostic), Peach [prunus persica], and Rosuvastatin    Social History:  The patient  reports that she has never smoked. She does not have any smokeless tobacco history on file. She reports current alcohol use of about 1.0 - 2.0 standard drink of alcohol per week. She reports that she does not use drugs.   Family History:  The patient's family history is not on file.    ROS:  Please see the history of present illness.   Otherwise, review of systems are positive for teeth pain which she thinks is causing elevated BP.   All other systems are reviewed and negative.    PHYSICAL EXAM: VS:  BP (!) 144/84   Pulse 75   Ht 5' 5"$  (1.651 m)   Wt 282 lb (127.9 kg)   SpO2 95%   BMI 46.93 kg/m  , BMI Body mass index is 46.93 kg/m. GEN: Well nourished, well developed, in no acute distress HEENT: normal Neck: no JVD, carotid bruits, or masses Cardiac: RRR; no murmurs, rubs, or gallops,no edema  Respiratory:  clear to auscultation bilaterally, normal work of breathing GI: soft, nontender, nondistended, + BS MS: no deformity or atrophy Skin: warm and dry, no rash Neuro:  Strength and sensation are intact Psych: euthymic mood, full affect   EKG:   The ekg ordered today demonstrates NSR, PACs, no ST changes   Recent Labs: 03/16/2022: BUN 13; Creatinine, Ser 1.09; Hemoglobin 13.7; Platelets 165; Potassium 4.1; Sodium 140   Lipid Panel    Component Value Date/Time   CHOL 184 11/27/2009 2152   TRIG 83 11/27/2009 2152   HDL 58 11/27/2009 2152   CHOLHDL 3.2 Ratio 11/27/2009  2152   VLDL 17 11/27/2009 2152   LDLCALC 109 (H) 11/27/2009 2152     Other studies Reviewed: Additional studies/ records that were reviewed today with results demonstrating: labs reviewed-.   ASSESSMENT AND PLAN:  Chest pain: She has had some chest discomfort when eating a hamburger, and a hot dog.  Plan for CTA of the coronary arteries to evaluate for any significant blockages.  Will give metoprolol 100 mg  2 hours prior to the study. Hypertension: Some of the blood pressure readings are elevated.  May need to increase the losartan/HCTZ to 100 mg / 25 mg daily.  Decrease salt intake.  Increase exercise to 150 minutes/week.  And then like decrease salt intake and cutting back on red meats and processed foods and things like that that may drop your that may drop your blood pressure as well.Trying to make lifestyle before making any changes in medicines and visit with  Dr. Delfina Redwood.  preDM: High fiber diet.  I explained to her that with significant weight loss, she may be able to come off of some blood pressure medications. Morbid obesity: We spoke about the importance of weight loss.    Current medicines are reviewed at length with the patient today.  The patient concerns regarding her medicines were addressed.  The following changes have been made:  No change  Labs/ tests ordered today include: CTA No orders of the defined types were placed in this encounter.   Recommend 150 minutes/week of aerobic exercise Low fat, low carb, high fiber diet recommended  Disposition:   FU in based on CTA results   Signed, Larae Grooms, MD  04/06/2022 9:57 AM    [facility] [place], [place],   [address] Phone: [phone]; Fax: [phone]

## 2022-04-11 DIAGNOSIS — K219 Gastro-esophageal reflux disease without esophagitis: Secondary | ICD-10-CM | POA: Diagnosis not present

## 2022-04-11 DIAGNOSIS — M1990 Unspecified osteoarthritis, unspecified site: Secondary | ICD-10-CM | POA: Diagnosis not present

## 2022-04-11 DIAGNOSIS — I10 Essential (primary) hypertension: Secondary | ICD-10-CM | POA: Diagnosis not present

## 2022-05-02 DIAGNOSIS — Z79899 Other long term (current) drug therapy: Secondary | ICD-10-CM | POA: Diagnosis not present

## 2022-05-02 DIAGNOSIS — N183 Chronic kidney disease, stage 3 unspecified: Secondary | ICD-10-CM | POA: Diagnosis not present

## 2022-05-02 DIAGNOSIS — I10 Essential (primary) hypertension: Secondary | ICD-10-CM | POA: Diagnosis not present

## 2022-05-02 DIAGNOSIS — R011 Cardiac murmur, unspecified: Secondary | ICD-10-CM | POA: Diagnosis not present

## 2022-05-02 DIAGNOSIS — Z0001 Encounter for general adult medical examination with abnormal findings: Secondary | ICD-10-CM | POA: Diagnosis not present

## 2022-05-05 ENCOUNTER — Telehealth: Payer: Self-pay | Admitting: *Deleted

## 2022-05-05 NOTE — Telephone Encounter (Signed)
I spoke with patient. She will have BMP done next week at [facility] in [place] and have lab work faxed to our office.

## 2022-05-05 NOTE — Telephone Encounter (Signed)
Patient will need BMP prior to Cardiac CT Scan.  I placed call to patient to schedule this.  Left message to call office.

## 2022-05-24 NOTE — Telephone Encounter (Signed)
Lab results have not been received in office. CTA was scheduled for April 16 but has been canceled.  Per cancellation note patient canceled and will call back in a month to reschedule.

## 2022-05-31 ENCOUNTER — Ambulatory Visit (HOSPITAL_COMMUNITY): Payer: 59

## 2022-08-10 ENCOUNTER — Encounter (HOSPITAL_COMMUNITY): Payer: Self-pay

## 2022-11-15 ENCOUNTER — Other Ambulatory Visit: Payer: Self-pay | Admitting: Internal Medicine

## 2022-11-15 ENCOUNTER — Encounter: Payer: Self-pay | Admitting: Internal Medicine

## 2022-11-15 DIAGNOSIS — Z1231 Encounter for screening mammogram for malignant neoplasm of breast: Secondary | ICD-10-CM

## 2022-12-22 ENCOUNTER — Ambulatory Visit: Payer: 59

## 2023-03-01 ENCOUNTER — Ambulatory Visit: Payer: 59

## 2023-04-28 ENCOUNTER — Ambulatory Visit
Admission: RE | Admit: 2023-04-28 | Discharge: 2023-04-28 | Disposition: A | Discharge status: Home / Self Care | Source: Ambulatory Visit | Attending: Internal Medicine | Admitting: Internal Medicine

## 2023-04-28 DIAGNOSIS — Z1231 Encounter for screening mammogram for malignant neoplasm of breast: Secondary | ICD-10-CM

## 2024-02-28 ENCOUNTER — Other Ambulatory Visit: Payer: Self-pay | Admitting: Nurse Practitioner

## 2024-02-28 DIAGNOSIS — Z86018 Personal history of other benign neoplasm: Secondary | ICD-10-CM

## 2024-03-13 ENCOUNTER — Other Ambulatory Visit

## 2024-03-26 ENCOUNTER — Other Ambulatory Visit
